# Patient Record
Sex: Male | Born: 1950 | Race: White | Hispanic: No | Marital: Married | Smoking: Never smoker
Health system: Southern US, Community
[De-identification: ages and names within clinical notes are randomized; demographics above are authoritative.]

## PROBLEM LIST (undated history)

## (undated) DIAGNOSIS — Z8719 Personal history of other diseases of the digestive system: Secondary | ICD-10-CM

## (undated) DIAGNOSIS — Z972 Presence of dental prosthetic device (complete) (partial): Secondary | ICD-10-CM

## (undated) DIAGNOSIS — M1990 Unspecified osteoarthritis, unspecified site: Secondary | ICD-10-CM

## (undated) DIAGNOSIS — I10 Essential (primary) hypertension: Secondary | ICD-10-CM

## (undated) DIAGNOSIS — R350 Frequency of micturition: Secondary | ICD-10-CM

## (undated) DIAGNOSIS — Z8709 Personal history of other diseases of the respiratory system: Secondary | ICD-10-CM

## (undated) DIAGNOSIS — Z8701 Personal history of pneumonia (recurrent): Secondary | ICD-10-CM

## (undated) DIAGNOSIS — M722 Plantar fascial fibromatosis: Secondary | ICD-10-CM

## (undated) DIAGNOSIS — K219 Gastro-esophageal reflux disease without esophagitis: Secondary | ICD-10-CM

## (undated) DIAGNOSIS — E559 Vitamin D deficiency, unspecified: Secondary | ICD-10-CM

## (undated) DIAGNOSIS — M719 Bursopathy, unspecified: Secondary | ICD-10-CM

## (undated) DIAGNOSIS — I4891 Unspecified atrial fibrillation: Secondary | ICD-10-CM

## (undated) DIAGNOSIS — IMO0001 Reserved for inherently not codable concepts without codable children: Secondary | ICD-10-CM

## (undated) DIAGNOSIS — J329 Chronic sinusitis, unspecified: Secondary | ICD-10-CM

## (undated) DIAGNOSIS — E785 Hyperlipidemia, unspecified: Secondary | ICD-10-CM

## (undated) DIAGNOSIS — J449 Chronic obstructive pulmonary disease, unspecified: Secondary | ICD-10-CM

## (undated) DIAGNOSIS — F419 Anxiety disorder, unspecified: Secondary | ICD-10-CM

## (undated) DIAGNOSIS — I499 Cardiac arrhythmia, unspecified: Secondary | ICD-10-CM

## (undated) DIAGNOSIS — H269 Unspecified cataract: Secondary | ICD-10-CM

## (undated) DIAGNOSIS — G43909 Migraine, unspecified, not intractable, without status migrainosus: Secondary | ICD-10-CM

## (undated) DIAGNOSIS — K08109 Complete loss of teeth, unspecified cause, unspecified class: Secondary | ICD-10-CM

## (undated) DIAGNOSIS — G4730 Sleep apnea, unspecified: Secondary | ICD-10-CM

## (undated) DIAGNOSIS — Z973 Presence of spectacles and contact lenses: Secondary | ICD-10-CM

## (undated) HISTORY — DX: Gastro-esophageal reflux disease without esophagitis: K21.9

## (undated) HISTORY — PX: KNEE ARTHROSCOPY: SUR90

## (undated) HISTORY — DX: Essential (primary) hypertension: I10

## (undated) HISTORY — DX: Vitamin D deficiency, unspecified: E55.9

## (undated) HISTORY — PX: SHOULDER ARTHROSCOPY: SHX128

## (undated) HISTORY — PX: CARDIAC CATHETERIZATION: SHX172

## (undated) HISTORY — DX: Plantar fascial fibromatosis: M72.2

## (undated) HISTORY — DX: Migraine, unspecified, not intractable, without status migrainosus: G43.909

## (undated) HISTORY — DX: Chronic obstructive pulmonary disease, unspecified: J44.9

## (undated) HISTORY — DX: Hyperlipidemia, unspecified: E78.5

## (undated) HISTORY — DX: Bursopathy, unspecified: M71.9

## (undated) HISTORY — DX: Chronic sinusitis, unspecified: J32.9

## (undated) HISTORY — PX: ESOPHAGOGASTRODUODENOSCOPY: SHX1529

## (undated) HISTORY — PX: CHOLECYSTECTOMY: SHX55

## (undated) HISTORY — PX: FINGER SURGERY: SHX640

## (undated) HISTORY — PX: COLONOSCOPY: SHX174

## (undated) HISTORY — DX: Sleep apnea, unspecified: G47.30

## (undated) HISTORY — DX: Unspecified atrial fibrillation: I48.91

## (undated) HISTORY — DX: Unspecified osteoarthritis, unspecified site: M19.90

---

## 1985-09-02 HISTORY — PX: EYE SURGERY: SHX253

## 2004-10-01 ENCOUNTER — Ambulatory Visit: Payer: Self-pay | Admitting: Internal Medicine

## 2004-10-29 ENCOUNTER — Ambulatory Visit: Payer: Self-pay | Admitting: Internal Medicine

## 2004-12-11 ENCOUNTER — Ambulatory Visit: Payer: Self-pay | Admitting: Internal Medicine

## 2004-12-12 ENCOUNTER — Ambulatory Visit: Payer: Self-pay | Admitting: Internal Medicine

## 2011-06-28 ENCOUNTER — Ambulatory Visit (HOSPITAL_COMMUNITY)
Admission: RE | Admit: 2011-06-28 | Discharge: 2011-06-28 | Disposition: A | Discharge status: Home / Self Care | Payer: Medicare Other | Source: Ambulatory Visit | Attending: Orthopedic Surgery | Admitting: Orthopedic Surgery

## 2011-06-28 ENCOUNTER — Other Ambulatory Visit (HOSPITAL_COMMUNITY): Payer: Self-pay | Admitting: Orthopedic Surgery

## 2011-06-28 ENCOUNTER — Encounter (HOSPITAL_COMMUNITY)
Admission: RE | Admit: 2011-06-28 | Discharge: 2011-06-28 | Disposition: A | Discharge status: Home / Self Care | Payer: Medicare Other | Source: Ambulatory Visit | Attending: Orthopedic Surgery | Admitting: Orthopedic Surgery

## 2011-06-28 DIAGNOSIS — M7540 Impingement syndrome of unspecified shoulder: Secondary | ICD-10-CM

## 2011-06-28 DIAGNOSIS — Z01818 Encounter for other preprocedural examination: Secondary | ICD-10-CM | POA: Insufficient documentation

## 2011-06-28 DIAGNOSIS — Z01810 Encounter for preprocedural cardiovascular examination: Secondary | ICD-10-CM | POA: Insufficient documentation

## 2011-06-28 DIAGNOSIS — Z01812 Encounter for preprocedural laboratory examination: Secondary | ICD-10-CM | POA: Insufficient documentation

## 2011-06-28 LAB — COMPREHENSIVE METABOLIC PANEL
ALT: 22 U/L (ref 0–53)
AST: 20 U/L (ref 0–37)
Alkaline Phosphatase: 101 U/L (ref 39–117)
CO2: 27 mEq/L (ref 19–32)
Chloride: 104 mEq/L (ref 96–112)
GFR calc non Af Amer: 79 mL/min — ABNORMAL LOW (ref >90)
Glucose, Bld: 106 mg/dL — ABNORMAL HIGH (ref 70–99)
Potassium: 4.1 mEq/L (ref 3.5–5.1)
Sodium: 141 mEq/L (ref 135–145)
Total Bilirubin: 1 mg/dL (ref 0.3–1.2)

## 2011-06-28 LAB — URINALYSIS, ROUTINE W REFLEX MICROSCOPIC
Glucose, UA: NEGATIVE mg/dL
Hgb urine dipstick: NEGATIVE
Leukocytes, UA: NEGATIVE
pH: 6 (ref 5.0–8.0)

## 2011-06-28 LAB — APTT: aPTT: 32 seconds (ref 24–37)

## 2011-06-28 LAB — CBC
HCT: 47.2 % (ref 39.0–52.0)
Hemoglobin: 15.8 g/dL (ref 13.0–17.0)
RBC: 5.5 MIL/uL (ref 4.22–5.81)

## 2011-07-04 ENCOUNTER — Ambulatory Visit (HOSPITAL_COMMUNITY)
Admission: RE | Admit: 2011-07-04 | Discharge: 2011-07-05 | Disposition: A | Discharge status: Home / Self Care | Payer: Medicare Other | Source: Ambulatory Visit | Attending: Orthopedic Surgery | Admitting: Orthopedic Surgery

## 2011-07-04 DIAGNOSIS — Z01812 Encounter for preprocedural laboratory examination: Secondary | ICD-10-CM | POA: Insufficient documentation

## 2011-07-04 DIAGNOSIS — M9420 Chondromalacia, unspecified site: Secondary | ICD-10-CM | POA: Insufficient documentation

## 2011-07-04 DIAGNOSIS — M24119 Other articular cartilage disorders, unspecified shoulder: Secondary | ICD-10-CM | POA: Insufficient documentation

## 2011-07-04 DIAGNOSIS — M67919 Unspecified disorder of synovium and tendon, unspecified shoulder: Secondary | ICD-10-CM | POA: Insufficient documentation

## 2011-07-04 DIAGNOSIS — Z01810 Encounter for preprocedural cardiovascular examination: Secondary | ICD-10-CM | POA: Insufficient documentation

## 2011-07-04 DIAGNOSIS — I10 Essential (primary) hypertension: Secondary | ICD-10-CM | POA: Insufficient documentation

## 2011-07-04 DIAGNOSIS — K219 Gastro-esophageal reflux disease without esophagitis: Secondary | ICD-10-CM | POA: Insufficient documentation

## 2011-07-04 DIAGNOSIS — E669 Obesity, unspecified: Secondary | ICD-10-CM | POA: Insufficient documentation

## 2011-07-04 DIAGNOSIS — M25819 Other specified joint disorders, unspecified shoulder: Secondary | ICD-10-CM | POA: Insufficient documentation

## 2011-07-04 DIAGNOSIS — Z01818 Encounter for other preprocedural examination: Secondary | ICD-10-CM | POA: Insufficient documentation

## 2011-07-04 DIAGNOSIS — M19019 Primary osteoarthritis, unspecified shoulder: Secondary | ICD-10-CM | POA: Insufficient documentation

## 2011-07-04 DIAGNOSIS — G4733 Obstructive sleep apnea (adult) (pediatric): Secondary | ICD-10-CM | POA: Insufficient documentation

## 2011-07-04 DIAGNOSIS — M719 Bursopathy, unspecified: Secondary | ICD-10-CM | POA: Insufficient documentation

## 2012-07-24 ENCOUNTER — Encounter: Payer: Self-pay | Admitting: Internal Medicine

## 2012-07-27 ENCOUNTER — Encounter: Payer: Self-pay | Admitting: Internal Medicine

## 2012-07-27 ENCOUNTER — Ambulatory Visit (INDEPENDENT_AMBULATORY_CARE_PROVIDER_SITE_OTHER): Payer: Medicare Other | Admitting: Internal Medicine

## 2012-07-27 ENCOUNTER — Ambulatory Visit (INDEPENDENT_AMBULATORY_CARE_PROVIDER_SITE_OTHER)
Admission: RE | Admit: 2012-07-27 | Discharge: 2012-07-27 | Disposition: A | Discharge status: Home / Self Care | Payer: Medicare Other | Source: Ambulatory Visit | Attending: Internal Medicine | Admitting: Internal Medicine

## 2012-07-27 VITALS — BP 112/76 | HR 75 | Temp 97.3°F | Ht 70.0 in | Wt 227.0 lb

## 2012-07-27 DIAGNOSIS — J45909 Unspecified asthma, uncomplicated: Secondary | ICD-10-CM

## 2012-07-27 DIAGNOSIS — R0609 Other forms of dyspnea: Secondary | ICD-10-CM

## 2012-07-27 DIAGNOSIS — R0600 Dyspnea, unspecified: Secondary | ICD-10-CM

## 2012-07-27 MED ORDER — BUDESONIDE-FORMOTEROL FUMARATE 160-4.5 MCG/ACT IN AERO: INHALATION_SPRAY | RESPIRATORY_TRACT | Status: AC

## 2012-07-27 MED ORDER — ALBUTEROL SULFATE (2.5 MG/3ML) 0.083% IN NEBU: 2.5000 mg | INHALATION_SOLUTION | RESPIRATORY_TRACT | Status: AC | PRN

## 2012-07-27 MED ORDER — FAMOTIDINE 20 MG PO TABS: ORAL_TABLET | ORAL | Status: DC

## 2012-07-27 NOTE — Progress Notes (Signed)
  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  Assoc with mostly dry cough and some sorethroat and dysphagia.  No obvious pattern to daytime variabilty or assoc  cp or chest tightness, subjective wheeze overt sinus or hb symptoms. No unusual exp hx or h/o childhood pna/ asthma or premature birth to his knowledge.   Sleeping ok without nocturnal  or early am exacerbation  of respiratory  c/o's or need for noct saba. Also denies any obvious fluctuation of symptoms with weather or environmental changes or other aggravating or alleviating factors except as outlined above    Review of Systems  Constitutional: Negative for fever, chills, activity change, appetite change and unexpected weight change.  HENT: Positive for sore throat and trouble swallowing. Negative for congestion, rhinorrhea, sneezing, dental problem, voice change and postnasal drip.   Eyes: Negative for visual disturbance.  Respiratory: Positive for cough and shortness of breath. Negative for choking.   Cardiovascular: Positive for chest pain and leg swelling.  Gastrointestinal: Negative for nausea, vomiting and abdominal pain.  Genitourinary: Negative for difficulty urinating.  Musculoskeletal: Positive for arthralgias.  Skin: Negative for rash.  Psychiatric/Behavioral: Negative for behavioral problems and confusion.       Objective:   Physical Exam Wt Readings from Last 3 Encounters:  07/27/12 227 lb (102.967 kg)  06/28/11  230 lb 6.1 oz (104.5 kg)    Obese wm with pot bellied pattern HEENT: nl dentition, turbinates, and orophanx. Nl external ear canals without cough reflex   NECK :  without JVD/Nodes/TM/ nl carotid upstrokes bilaterally   LUNGS: no acc muscle use, clear to A and P bilaterally without cough on insp or exp maneuvers   CV:  RRR  no s3 or murmur or increase in P2, no edema   ABD:  soft and nontender with nl excursion in the supine position. No bruits or organomegaly, bowel sounds nl  MS:  warm without deformities, calf tenderness, cyanosis or clubbing  SKIN: warm and dry without lesions    NEURO:  alert, approp, no deficits    CXR  07/27/2012 :  Hypoinflation with mild vascular crowding in the infrahilar regions due to low lung volumes. No acute disease.        Assessment & Plan:

## 2012-07-27 NOTE — Progress Notes (Signed)
Quick Note:  Spoke with pt and notified of results per Dr. Wert. Pt verbalized understanding and denied any questions.  ______ 

## 2012-07-27 NOTE — Assessment & Plan Note (Signed)
-   07/27/2012  Walked RA x 3 laps @ 185 ft each stopped due to  End of study no sob, no desat  Symptoms are markedly disproportionate to objective findings and not clear this is a lung problem but pt does appear to have difficult airway management issues. DDX of  difficult airways managment all start with A and  include Adherence, Ace Inhibitors, Acid Reflux, Active Sinus Disease, Alpha 1 Antitripsin deficiency, Anxiety masquerading as Airways dz,  ABPA,  allergy(esp in young), Aspiration (esp in elderly), Adverse effects of DPI,  Active smokers, plus two Bs  = Bronchiectasis and Beta blocker use..and one C= CHF  Adherence is always the initial "prime suspect" and is a multilayered concern that requires a "trust but verify" approach in every patient - starting with knowing how to use medications, especially inhalers, correctly, keeping up with refills and understanding the fundamental difference between maintenance and prns vs those medications only taken for a very short course and then stopped and not refilled. The proper method of use, as well as anticipated side effects, of a metered-dose inhaler are discussed and demonstrated to the patient. Improved effectiveness after extensive coaching during this visit to a level of approximately  75% so try symbicort 160 2bid  ? Adverse effect of dpi's > try stop advair and spiriva  ? Acid reflux related > gerd rx/ diet reviewed  No evidence at all to suggest black lung dz here radiographically now 20 years since last exposure.

## 2012-07-27 NOTE — Assessment & Plan Note (Addendum)
Goal will be to maintain improvement off systemic steroids which may complicate his needed efforts to lose wt.  The proper method of use, as well as anticipated side effects, of a metered-dose inhaler are discussed and demonstrated to the patient. Improved effectiveness after extensive coaching during this visit to a level of approximately  75%

## 2012-07-27 NOTE — Patient Instructions (Signed)
Stop advair and spiriva  symbicort 160 Take 2 puffs first thing in am and then another 2 puffs about 12 hours later.     Work on inhaler technique:  relax and gently blow all the way out then take a nice smooth deep breath back in, triggering the inhaler at same time you start breathing in.  Hold for up to 5 seconds if you can.  Rinse and gargle with water when done   If your mouth or throat starts to bother you,   I suggest you time the inhaler to your dental care and after using the inhaler(s) brush teeth and tongue with a baking soda containing toothpaste and when you rinse this out, gargle with it first to see if this helps your mouth and throat.     Please remember to go to the  x-ray department downstairs for your tests - we will call you with the results when they are available.  Please schedule a follow up office visit in 6 weeks, call sooner if needed with pfts

## 2012-07-29 ENCOUNTER — Encounter: Payer: Self-pay | Admitting: Internal Medicine

## 2012-09-11 ENCOUNTER — Ambulatory Visit: Payer: Medicare Other | Admitting: Internal Medicine

## 2015-06-06 ENCOUNTER — Encounter (HOSPITAL_COMMUNITY): Payer: Self-pay

## 2015-06-06 ENCOUNTER — Encounter (HOSPITAL_COMMUNITY)
Admission: RE | Admit: 2015-06-06 | Discharge: 2015-06-06 | Disposition: A | Discharge status: Home / Self Care | Payer: Medicare HMO | Source: Ambulatory Visit | Attending: Orthopedic Surgery | Admitting: Orthopedic Surgery

## 2015-06-06 ENCOUNTER — Other Ambulatory Visit: Payer: Self-pay

## 2015-06-06 DIAGNOSIS — X58XXXA Exposure to other specified factors, initial encounter: Secondary | ICD-10-CM | POA: Diagnosis not present

## 2015-06-06 DIAGNOSIS — G4733 Obstructive sleep apnea (adult) (pediatric): Secondary | ICD-10-CM | POA: Diagnosis not present

## 2015-06-06 DIAGNOSIS — Z7951 Long term (current) use of inhaled steroids: Secondary | ICD-10-CM | POA: Diagnosis not present

## 2015-06-06 DIAGNOSIS — M75122 Complete rotator cuff tear or rupture of left shoulder, not specified as traumatic: Secondary | ICD-10-CM | POA: Diagnosis not present

## 2015-06-06 DIAGNOSIS — M94212 Chondromalacia, left shoulder: Secondary | ICD-10-CM | POA: Diagnosis not present

## 2015-06-06 DIAGNOSIS — S46112A Strain of muscle, fascia and tendon of long head of biceps, left arm, initial encounter: Secondary | ICD-10-CM | POA: Diagnosis not present

## 2015-06-06 DIAGNOSIS — Z791 Long term (current) use of non-steroidal anti-inflammatories (NSAID): Secondary | ICD-10-CM | POA: Diagnosis not present

## 2015-06-06 DIAGNOSIS — M7542 Impingement syndrome of left shoulder: Secondary | ICD-10-CM | POA: Diagnosis not present

## 2015-06-06 DIAGNOSIS — Z79891 Long term (current) use of opiate analgesic: Secondary | ICD-10-CM | POA: Diagnosis not present

## 2015-06-06 DIAGNOSIS — E669 Obesity, unspecified: Secondary | ICD-10-CM | POA: Diagnosis not present

## 2015-06-06 DIAGNOSIS — J449 Chronic obstructive pulmonary disease, unspecified: Secondary | ICD-10-CM | POA: Diagnosis not present

## 2015-06-06 DIAGNOSIS — E559 Vitamin D deficiency, unspecified: Secondary | ICD-10-CM | POA: Diagnosis not present

## 2015-06-06 DIAGNOSIS — I10 Essential (primary) hypertension: Secondary | ICD-10-CM | POA: Diagnosis not present

## 2015-06-06 DIAGNOSIS — S43492A Other sprain of left shoulder joint, initial encounter: Secondary | ICD-10-CM | POA: Diagnosis not present

## 2015-06-06 DIAGNOSIS — Z79899 Other long term (current) drug therapy: Secondary | ICD-10-CM | POA: Diagnosis not present

## 2015-06-06 DIAGNOSIS — K219 Gastro-esophageal reflux disease without esophagitis: Secondary | ICD-10-CM | POA: Diagnosis not present

## 2015-06-06 DIAGNOSIS — M1990 Unspecified osteoarthritis, unspecified site: Secondary | ICD-10-CM | POA: Diagnosis not present

## 2015-06-06 DIAGNOSIS — E785 Hyperlipidemia, unspecified: Secondary | ICD-10-CM | POA: Diagnosis not present

## 2015-06-06 DIAGNOSIS — Z6830 Body mass index (BMI) 30.0-30.9, adult: Secondary | ICD-10-CM | POA: Diagnosis not present

## 2015-06-06 HISTORY — DX: Personal history of pneumonia (recurrent): Z87.01

## 2015-06-06 HISTORY — DX: Reserved for inherently not codable concepts without codable children: IMO0001

## 2015-06-06 HISTORY — DX: Cardiac arrhythmia, unspecified: I49.9

## 2015-06-06 HISTORY — DX: Personal history of other diseases of the digestive system: Z87.19

## 2015-06-06 HISTORY — DX: Frequency of micturition: R35.0

## 2015-06-06 HISTORY — DX: Personal history of other diseases of the respiratory system: Z87.09

## 2015-06-06 LAB — COMPREHENSIVE METABOLIC PANEL WITH GFR
ALT: 19 U/L (ref 17–63)
AST: 24 U/L (ref 15–41)
Albumin: 3.8 g/dL (ref 3.5–5.0)
Alkaline Phosphatase: 95 U/L (ref 38–126)
Anion gap: 8 (ref 5–15)
BUN: 8 mg/dL (ref 6–20)
CO2: 24 mmol/L (ref 22–32)
Calcium: 9.1 mg/dL (ref 8.9–10.3)
Chloride: 106 mmol/L (ref 101–111)
Creatinine, Ser: 0.84 mg/dL (ref 0.61–1.24)
GFR calc Af Amer: >60 mL/min
GFR calc non Af Amer: >60 mL/min
Glucose, Bld: 97 mg/dL (ref 65–99)
Potassium: 3.8 mmol/L (ref 3.5–5.1)
Sodium: 138 mmol/L (ref 135–145)
Total Bilirubin: 1.1 mg/dL (ref 0.3–1.2)
Total Protein: 6.8 g/dL (ref 6.5–8.1)

## 2015-06-06 LAB — CBC WITH DIFFERENTIAL/PLATELET
BASOS PCT: 1 %
Basophils Absolute: 0.1 10*3/uL (ref 0.0–0.1)
Eosinophils Absolute: 0.2 10*3/uL (ref 0.0–0.7)
Eosinophils Relative: 3 %
HEMATOCRIT: 48.3 % (ref 39.0–52.0)
Hemoglobin: 16 g/dL (ref 13.0–17.0)
Lymphocytes Relative: 30 %
Lymphs Abs: 1.7 10*3/uL (ref 0.7–4.0)
MCH: 29.7 pg (ref 26.0–34.0)
MCHC: 33.1 g/dL (ref 30.0–36.0)
MCV: 89.8 fL (ref 78.0–100.0)
MONO ABS: 0.8 10*3/uL (ref 0.1–1.0)
MONOS PCT: 14 %
NEUTROS ABS: 3 10*3/uL (ref 1.7–7.7)
Neutrophils Relative %: 52 %
Platelets: 244 10*3/uL (ref 150–400)
RBC: 5.38 MIL/uL (ref 4.22–5.81)
RDW: 13.3 % (ref 11.5–15.5)
WBC: 5.7 10*3/uL (ref 4.0–10.5)

## 2015-06-06 LAB — APTT: aPTT: 28 s (ref 24–37)

## 2015-06-06 LAB — PROTIME-INR
INR: 1 (ref 0.00–1.49)
Prothrombin Time: 13.4 s (ref 11.6–15.2)

## 2015-06-06 NOTE — Pre-Procedure Instructions (Signed)
    Ethan Harper  06/06/2015      [id number] - [place], [name] - [address] [place] [address] Phone: [phone] Fax: [phone]    Your procedure is scheduled on Thursday, October 6th, 2016.   Report to [facility] Admitting at 7:50 A.M.  Call this number if you have problems the morning of surgery:  [phone]   Remember:  Do not eat food or drink liquids after midnight.   Take these medicines the morning of surgery with A SIP OF WATER: Albuterol nebulizer if needed, Amlodipine (Norvasc), Azelastine (Astelin) if needed, Budesonide-formoterol inhaler, Esomeprazole (Nexium), Famotidine (Pepcid), Hydrocodone-acetaminophen (Norco) if needed, Triamcinolone (Nasocort) if needed.  Stop taking: Aspirin, NSAIDS, Naproxen, Aleve, Ibuprofen, Motrin, Advil, BC's, Goody's, fish oil, all herbal medications, and all vitamins.     Do not wear jewelry.  Do not wear lotions, powders, or colognes.  You may NOT wear deodorant.  Men may shave face and neck.  Do not bring valuables to the hospital.  [facility] is not responsible for any belongings or valuables.  Contacts, dentures or bridgework may not be worn into surgery.  Leave your suitcase in the car.  After surgery it may be brought to your room.  For patients admitted to the hospital, discharge time will be determined by your treatment team.  Patients discharged the day of surgery will not be allowed to drive home.   Special instructions:  See attached.   Please read over the following fact sheets that you were given. Pain Booklet, Coughing and Deep Breathing and Surgical Site Infection Prevention

## 2015-06-06 NOTE — Progress Notes (Signed)
PCP - Dr. Shary Decamp Cardiologist - Dr. Tomie China at [facility] - pt. States that he has not seen in approximately 7 years  EKG- 06/06/15 CXR - denies  Echo- denies Stress test - requested from [facility] - apprx. 7 years ago Cardiac Cath - requested from [facility] - approx. 10 years ago  Sleep study requested from Dr. Blenda Nicely.  Patient reports that he does not wear his CPAP at night.  Patient denies shortness of breath and chest pain at PAT appointment.

## 2015-06-07 ENCOUNTER — Encounter (HOSPITAL_COMMUNITY): Payer: Self-pay

## 2015-06-07 MED ORDER — CHLORHEXIDINE GLUCONATE 4 % EX LIQD: 60.0000 mL | Freq: Once | CUTANEOUS | Status: DC

## 2015-06-07 MED ORDER — CEFAZOLIN SODIUM-DEXTROSE 2-3 GM-% IV SOLR
2.0000 g | INTRAVENOUS | Status: AC
  Administered 2015-06-08: 2 g via INTRAVENOUS
  Filled 2015-06-07: qty 50

## 2015-06-07 MED ORDER — LACTATED RINGERS IV SOLN: INTRAVENOUS | Status: DC

## 2015-06-07 NOTE — Progress Notes (Signed)
Anesthesia Chart Review: Patient is a 64 year old male scheduled for left shoulder arthroscopy with subacromial decompression and rotator cuff repair on 06/08/15 by Dr. Rennis Chris.   History includes non-smoker, COPD, HTN, HLD, migraines, OSA (no CPAP), GERD, SOB, hiatal hernia, dysrhythmia (not specified), cholecystectomy. PCP is Dr. Shary Decamp. Pulmonologist is Dr. Blenda Nicely. Has seen cardiologist Dr. Tomie China (now with [facility]) in the past, but had normal coronaries with NL LVF by [facility] in 2010.   Meds include albuterol, Elavil, amlodipine, Astelin, Symbicort, Nexium, Pepcid, Lasix, Norco, Livalo, Nasacort, Zomig, Ambien.   06/06/15 EKG: NSR, minimal voltage criteria for LVH, may be normal variant.  12/15/08 [facility] ([facility]; scanned under Media tab, encounter 07/04/11): Normal coronaries, normal LVF.  Last echo seen was from 04/09/02 and was normal other than mild TR. (Scanned under Media tab, encounter 07/04/11.)  Preoperative labs WNL.  Anticipate that he can proceed as planned.  Velna Ochs [facility] Short Stay Center/Anesthesiology Phone [phone] 06/07/2015 1:36 PM

## 2015-06-08 ENCOUNTER — Observation Stay (HOSPITAL_COMMUNITY)
Admission: RE | Admit: 2015-06-08 | Discharge: 2015-06-09 | Disposition: A | Discharge status: Home / Self Care | Payer: Medicare HMO | Source: Ambulatory Visit | Attending: Orthopedic Surgery | Admitting: Orthopedic Surgery

## 2015-06-08 ENCOUNTER — Encounter (HOSPITAL_COMMUNITY): Payer: Self-pay | Admitting: *Deleted

## 2015-06-08 ENCOUNTER — Ambulatory Visit (HOSPITAL_COMMUNITY): Payer: Medicare HMO | Admitting: Anesthesiology

## 2015-06-08 ENCOUNTER — Encounter (HOSPITAL_COMMUNITY)
Admission: RE | Disposition: A | Discharge status: Home / Self Care | Payer: Self-pay | Source: Ambulatory Visit | Attending: Orthopedic Surgery

## 2015-06-08 ENCOUNTER — Ambulatory Visit (HOSPITAL_COMMUNITY): Payer: Medicare HMO | Admitting: Vascular Surgery

## 2015-06-08 DIAGNOSIS — S43492A Other sprain of left shoulder joint, initial encounter: Secondary | ICD-10-CM | POA: Diagnosis not present

## 2015-06-08 DIAGNOSIS — Z7951 Long term (current) use of inhaled steroids: Secondary | ICD-10-CM | POA: Insufficient documentation

## 2015-06-08 DIAGNOSIS — Z79891 Long term (current) use of opiate analgesic: Secondary | ICD-10-CM | POA: Insufficient documentation

## 2015-06-08 DIAGNOSIS — X58XXXA Exposure to other specified factors, initial encounter: Secondary | ICD-10-CM | POA: Insufficient documentation

## 2015-06-08 DIAGNOSIS — J449 Chronic obstructive pulmonary disease, unspecified: Secondary | ICD-10-CM | POA: Insufficient documentation

## 2015-06-08 DIAGNOSIS — S46112A Strain of muscle, fascia and tendon of long head of biceps, left arm, initial encounter: Secondary | ICD-10-CM | POA: Insufficient documentation

## 2015-06-08 DIAGNOSIS — M94212 Chondromalacia, left shoulder: Secondary | ICD-10-CM | POA: Diagnosis not present

## 2015-06-08 DIAGNOSIS — E785 Hyperlipidemia, unspecified: Secondary | ICD-10-CM | POA: Insufficient documentation

## 2015-06-08 DIAGNOSIS — M75122 Complete rotator cuff tear or rupture of left shoulder, not specified as traumatic: Secondary | ICD-10-CM | POA: Insufficient documentation

## 2015-06-08 DIAGNOSIS — E669 Obesity, unspecified: Secondary | ICD-10-CM | POA: Insufficient documentation

## 2015-06-08 DIAGNOSIS — K219 Gastro-esophageal reflux disease without esophagitis: Secondary | ICD-10-CM | POA: Insufficient documentation

## 2015-06-08 DIAGNOSIS — Z9889 Other specified postprocedural states: Secondary | ICD-10-CM

## 2015-06-08 DIAGNOSIS — E559 Vitamin D deficiency, unspecified: Secondary | ICD-10-CM | POA: Insufficient documentation

## 2015-06-08 DIAGNOSIS — M1990 Unspecified osteoarthritis, unspecified site: Secondary | ICD-10-CM | POA: Insufficient documentation

## 2015-06-08 DIAGNOSIS — Z791 Long term (current) use of non-steroidal anti-inflammatories (NSAID): Secondary | ICD-10-CM | POA: Insufficient documentation

## 2015-06-08 DIAGNOSIS — M7542 Impingement syndrome of left shoulder: Principal | ICD-10-CM | POA: Insufficient documentation

## 2015-06-08 DIAGNOSIS — Z79899 Other long term (current) drug therapy: Secondary | ICD-10-CM | POA: Insufficient documentation

## 2015-06-08 DIAGNOSIS — G4733 Obstructive sleep apnea (adult) (pediatric): Secondary | ICD-10-CM | POA: Insufficient documentation

## 2015-06-08 DIAGNOSIS — Z6830 Body mass index (BMI) 30.0-30.9, adult: Secondary | ICD-10-CM | POA: Insufficient documentation

## 2015-06-08 DIAGNOSIS — I10 Essential (primary) hypertension: Secondary | ICD-10-CM | POA: Insufficient documentation

## 2015-06-08 HISTORY — PX: SHOULDER ARTHROSCOPY WITH SUBACROMIAL DECOMPRESSION, ROTATOR CUFF REPAIR AND BICEP TENDON REPAIR: SHX5687

## 2015-06-08 SURGERY — SHOULDER ARTHROSCOPY WITH SUBACROMIAL DECOMPRESSION, ROTATOR CUFF REPAIR AND BICEP TENDON REPAIR
Anesthesia: General | Site: Shoulder | Laterality: Left

## 2015-06-08 MED ORDER — KETOROLAC TROMETHAMINE 15 MG/ML IJ SOLN
15.0000 mg | Freq: Three times a day (TID) | INTRAMUSCULAR | Status: DC
  Administered 2015-06-08 – 2015-06-09 (×3): 15 mg via INTRAVENOUS
  Filled 2015-06-08 (×3): qty 1

## 2015-06-08 MED ORDER — FENTANYL CITRATE (PF) 100 MCG/2ML IJ SOLN
INTRAMUSCULAR | Status: DC | PRN
  Administered 2015-06-08: 50 ug via INTRAVENOUS
  Administered 2015-06-08: 100 ug via INTRAVENOUS
  Administered 2015-06-08: 25 ug via INTRAVENOUS

## 2015-06-08 MED ORDER — ONDANSETRON HCL 4 MG/2ML IJ SOLN
INTRAMUSCULAR | Status: DC | PRN
  Administered 2015-06-08: 4 mg via INTRAVENOUS

## 2015-06-08 MED ORDER — MIDAZOLAM HCL 2 MG/2ML IJ SOLN
INTRAMUSCULAR | Status: AC
  Administered 2015-06-08: 2 mg
  Filled 2015-06-08: qty 2

## 2015-06-08 MED ORDER — FENTANYL CITRATE (PF) 100 MCG/2ML IJ SOLN: 25.0000 ug | INTRAMUSCULAR | Status: DC | PRN

## 2015-06-08 MED ORDER — BUDESONIDE-FORMOTEROL FUMARATE 160-4.5 MCG/ACT IN AERO
2.0000 | INHALATION_SPRAY | Freq: Two times a day (BID) | RESPIRATORY_TRACT | Status: DC
  Filled 2015-06-08: qty 6

## 2015-06-08 MED ORDER — PRAVASTATIN SODIUM 40 MG PO TABS
40.0000 mg | ORAL_TABLET | Freq: Every day | ORAL | Status: DC
  Administered 2015-06-08: 40 mg via ORAL
  Filled 2015-06-08: qty 1

## 2015-06-08 MED ORDER — HYDROMORPHONE HCL 2 MG PO TABS: 2.0000 mg | ORAL_TABLET | ORAL | Status: DC | PRN

## 2015-06-08 MED ORDER — FENTANYL CITRATE (PF) 100 MCG/2ML IJ SOLN
INTRAMUSCULAR | Status: AC
  Administered 2015-06-08: 50 ug
  Filled 2015-06-08: qty 2

## 2015-06-08 MED ORDER — GLYCOPYRROLATE 0.2 MG/ML IJ SOLN
INTRAMUSCULAR | Status: AC
  Filled 2015-06-08: qty 4

## 2015-06-08 MED ORDER — NEOSTIGMINE METHYLSULFATE 10 MG/10ML IV SOLN
INTRAVENOUS | Status: DC | PRN
  Administered 2015-06-08: 5 mg via INTRAVENOUS

## 2015-06-08 MED ORDER — PHENYLEPHRINE HCL 10 MG/ML IJ SOLN
INTRAMUSCULAR | Status: DC | PRN
  Administered 2015-06-08: 80 ug via INTRAVENOUS
  Administered 2015-06-08: 40 ug via INTRAVENOUS
  Administered 2015-06-08 (×3): 80 ug via INTRAVENOUS

## 2015-06-08 MED ORDER — AZELASTINE HCL 0.1 % NA SOLN: 2.0000 | Freq: Two times a day (BID) | NASAL | Status: DC | PRN

## 2015-06-08 MED ORDER — LIDOCAINE HCL (CARDIAC) 20 MG/ML IV SOLN
INTRAVENOUS | Status: DC | PRN
Start: 2015-06-08 — End: 2015-06-08
  Administered 2015-06-08: 100 mg via INTRAVENOUS

## 2015-06-08 MED ORDER — ALBUTEROL SULFATE (2.5 MG/3ML) 0.083% IN NEBU: 2.5000 mg | INHALATION_SOLUTION | RESPIRATORY_TRACT | Status: DC | PRN

## 2015-06-08 MED ORDER — FLUTICASONE PROPIONATE 50 MCG/ACT NA SUSP
1.0000 | Freq: Every day | NASAL | Status: DC
  Filled 2015-06-08 (×2): qty 16

## 2015-06-08 MED ORDER — GLYCOPYRROLATE 0.2 MG/ML IJ SOLN
INTRAMUSCULAR | Status: DC | PRN
  Administered 2015-06-08: .8 mg via INTRAVENOUS

## 2015-06-08 MED ORDER — NEOSTIGMINE METHYLSULFATE 10 MG/10ML IV SOLN
INTRAVENOUS | Status: AC
  Filled 2015-06-08: qty 4

## 2015-06-08 MED ORDER — LACTATED RINGERS IV SOLN
INTRAVENOUS | Status: DC
  Administered 2015-06-08: 50 mL/h via INTRAVENOUS
  Administered 2015-06-08: 11:00:00 via INTRAVENOUS

## 2015-06-08 MED ORDER — BUPIVACAINE-EPINEPHRINE (PF) 0.5% -1:200000 IJ SOLN
INTRAMUSCULAR | Status: DC | PRN
  Administered 2015-06-08: 15 mL via PERINEURAL

## 2015-06-08 MED ORDER — KETOROLAC TROMETHAMINE 15 MG/ML IJ SOLN
INTRAMUSCULAR | Status: AC
Start: 2015-06-08 — End: 2015-06-09
  Filled 2015-06-08: qty 1

## 2015-06-08 MED ORDER — ROCURONIUM BROMIDE 100 MG/10ML IV SOLN
INTRAVENOUS | Status: DC | PRN
  Administered 2015-06-08: 50 mg via INTRAVENOUS

## 2015-06-08 MED ORDER — GLYCOPYRROLATE 0.2 MG/ML IJ SOLN
INTRAMUSCULAR | Status: AC
Start: 2015-06-08 — End: 2015-06-08
  Filled 2015-06-08: qty 4

## 2015-06-08 MED ORDER — PHENOL 1.4 % MT LIQD: 1.0000 | OROMUCOSAL | Status: DC | PRN

## 2015-06-08 MED ORDER — FENTANYL CITRATE (PF) 250 MCG/5ML IJ SOLN
INTRAMUSCULAR | Status: AC
  Filled 2015-06-08: qty 5

## 2015-06-08 MED ORDER — AMITRIPTYLINE HCL 50 MG PO TABS
50.0000 mg | ORAL_TABLET | Freq: Every day | ORAL | Status: DC
  Administered 2015-06-08: 50 mg via ORAL
  Filled 2015-06-08: qty 1

## 2015-06-08 MED ORDER — ONDANSETRON HCL 4 MG PO TABS: 4.0000 mg | ORAL_TABLET | ORAL | Status: DC | PRN

## 2015-06-08 MED ORDER — ONDANSETRON HCL 4 MG/2ML IJ SOLN: 4.0000 mg | INTRAMUSCULAR | Status: DC | PRN

## 2015-06-08 MED ORDER — PHENYLEPHRINE 40 MCG/ML (10ML) SYRINGE FOR IV PUSH (FOR BLOOD PRESSURE SUPPORT)
PREFILLED_SYRINGE | INTRAVENOUS | Status: AC
  Filled 2015-06-08: qty 10

## 2015-06-08 MED ORDER — LIDOCAINE HCL (CARDIAC) 20 MG/ML IV SOLN
INTRAVENOUS | Status: AC
  Filled 2015-06-08: qty 5

## 2015-06-08 MED ORDER — AMLODIPINE BESYLATE 10 MG PO TABS
10.0000 mg | ORAL_TABLET | Freq: Every day | ORAL | Status: DC
  Administered 2015-06-09: 10 mg via ORAL
  Filled 2015-06-08: qty 1

## 2015-06-08 MED ORDER — FUROSEMIDE 20 MG PO TABS: 20.0000 mg | ORAL_TABLET | Freq: Every day | ORAL | Status: DC | PRN

## 2015-06-08 MED ORDER — EPHEDRINE SULFATE 50 MG/ML IJ SOLN
INTRAMUSCULAR | Status: DC | PRN
  Administered 2015-06-08: 15 mg via INTRAVENOUS
  Administered 2015-06-08 (×3): 10 mg via INTRAVENOUS

## 2015-06-08 MED ORDER — ONDANSETRON HCL 4 MG/2ML IJ SOLN
INTRAMUSCULAR | Status: AC
  Filled 2015-06-08: qty 2

## 2015-06-08 MED ORDER — DIAZEPAM 5 MG PO TABS: 2.5000 mg | ORAL_TABLET | Freq: Four times a day (QID) | ORAL | Status: DC | PRN

## 2015-06-08 MED ORDER — HYDROCODONE-ACETAMINOPHEN 10-325 MG PO TABS: 1.0000 | ORAL_TABLET | ORAL | Status: DC | PRN

## 2015-06-08 MED ORDER — PROPOFOL 10 MG/ML IV BOLUS
INTRAVENOUS | Status: AC
  Filled 2015-06-08: qty 20

## 2015-06-08 MED ORDER — PROMETHAZINE HCL 25 MG/ML IJ SOLN: 6.2500 mg | INTRAMUSCULAR | Status: DC | PRN

## 2015-06-08 MED ORDER — ROCURONIUM BROMIDE 50 MG/5ML IV SOLN
INTRAVENOUS | Status: AC
  Filled 2015-06-08: qty 1

## 2015-06-08 MED ORDER — SODIUM CHLORIDE 0.9 % IR SOLN
Status: DC | PRN
Start: 2015-06-08 — End: 2015-06-08
  Administered 2015-06-08 (×2): 6000 mL

## 2015-06-08 MED ORDER — PANTOPRAZOLE SODIUM 40 MG PO TBEC
40.0000 mg | DELAYED_RELEASE_TABLET | Freq: Every day | ORAL | Status: DC
  Administered 2015-06-08 – 2015-06-09 (×2): 40 mg via ORAL
  Filled 2015-06-08 (×2): qty 1

## 2015-06-08 MED ORDER — EPHEDRINE SULFATE 50 MG/ML IJ SOLN
INTRAMUSCULAR | Status: AC
  Filled 2015-06-08: qty 1

## 2015-06-08 MED ORDER — HYDROCODONE-ACETAMINOPHEN 10-325 MG PO TABS
1.0000 | ORAL_TABLET | ORAL | Status: DC | PRN
  Administered 2015-06-08 – 2015-06-09 (×4): 2 via ORAL
  Filled 2015-06-08 (×4): qty 2

## 2015-06-08 MED ORDER — LACTATED RINGERS IV SOLN
INTRAVENOUS | Status: DC
  Administered 2015-06-08: 16:00:00 via INTRAVENOUS

## 2015-06-08 MED ORDER — PROPOFOL 10 MG/ML IV BOLUS
INTRAVENOUS | Status: DC | PRN
  Administered 2015-06-08: 150 mg via INTRAVENOUS

## 2015-06-08 SURGICAL SUPPLY — 62 items
ANCHOR PEEK 4.75X19.1 SWLK C (Anchor) ×3 IMPLANT
ANCHOR PEEK SWIVEL LOCK 5.5 (Anchor) ×6 IMPLANT
BLADE CUTTER GATOR 3.5 (BLADE) ×3 IMPLANT
BLADE GREAT WHITE 4.2 (BLADE) ×2 IMPLANT
BLADE GREAT WHITE 4.2MM (BLADE) ×1
BLADE SURG 11 STRL SS (BLADE) ×3 IMPLANT
BOOTCOVER CLEANROOM LRG (PROTECTIVE WEAR) IMPLANT
BUR OVAL 4.0 (BURR) ×3 IMPLANT
CANISTER SUCT LVC 12 LTR MEDI- (MISCELLANEOUS) ×3 IMPLANT
CANNULA ACUFLEX KIT 5X76 (CANNULA) ×3 IMPLANT
CANNULA DRILOCK 5.0MMX75MM (CANNULA) ×1
CANNULA DRILOCK 5.0X75 (CANNULA) ×2 IMPLANT
CANNULA TWIST IN 8.25X7CM (CANNULA) ×3 IMPLANT
CLOSURE WOUND 1/2 X4 (GAUZE/BANDAGES/DRESSINGS) ×1
CONNECTOR 5 IN 1 STRAIGHT STRL (MISCELLANEOUS) ×3 IMPLANT
DRAPE INCISE 23X17 IOBAN STRL (DRAPES)
DRAPE INCISE IOBAN 23X17 STRL (DRAPES) IMPLANT
DRAPE ORTHO SPLIT 77X108 STRL (DRAPES) ×4
DRAPE STERI 35X30 U-POUCH (DRAPES) IMPLANT
DRAPE SURG 17X11 SM STRL (DRAPES) ×3 IMPLANT
DRAPE SURG ORHT 6 SPLT 77X108 (DRAPES) ×2 IMPLANT
DRAPE U-SHAPE 47X51 STRL (DRAPES) IMPLANT
DRSG PAD ABDOMINAL 8X10 ST (GAUZE/BANDAGES/DRESSINGS) ×6 IMPLANT
DURAPREP 26ML APPLICATOR (WOUND CARE) ×3 IMPLANT
GAUZE SPONGE 4X4 12PLY STRL (GAUZE/BANDAGES/DRESSINGS) ×3 IMPLANT
GLOVE BIO SURGEON STRL SZ7.5 (GLOVE) ×3 IMPLANT
GLOVE BIO SURGEON STRL SZ8 (GLOVE) ×3 IMPLANT
GLOVE EUDERMIC 7 POWDERFREE (GLOVE) ×3 IMPLANT
GLOVE SS BIOGEL STRL SZ 7.5 (GLOVE) ×1 IMPLANT
GLOVE SUPERSENSE BIOGEL SZ 7.5 (GLOVE) ×2
GOWN STRL REUS W/ TWL LRG LVL3 (GOWN DISPOSABLE) ×1 IMPLANT
GOWN STRL REUS W/ TWL XL LVL3 (GOWN DISPOSABLE) ×4 IMPLANT
GOWN STRL REUS W/TWL LRG LVL3 (GOWN DISPOSABLE) ×2
GOWN STRL REUS W/TWL XL LVL3 (GOWN DISPOSABLE) ×8
KIT BASIN OR (CUSTOM PROCEDURE TRAY) ×3 IMPLANT
KIT ROOM TURNOVER OR (KITS) ×3 IMPLANT
KIT SHOULDER TRACTION (DRAPES) ×3 IMPLANT
MANIFOLD NEPTUNE II (INSTRUMENTS) ×3 IMPLANT
NDL SUT 6 .5 CRC .975X.05 MAYO (NEEDLE) IMPLANT
NEEDLE MAYO TAPER (NEEDLE)
NEEDLE SCORPION MULTI FIRE (NEEDLE) ×3 IMPLANT
NEEDLE SPNL 18GX3.5 QUINCKE PK (NEEDLE) ×3 IMPLANT
NS IRRIG 1000ML POUR BTL (IV SOLUTION) IMPLANT
PACK SHOULDER (CUSTOM PROCEDURE TRAY) ×3 IMPLANT
PAD ARMBOARD 7.5X6 YLW CONV (MISCELLANEOUS) ×6 IMPLANT
SET ARTHROSCOPY TUBING (MISCELLANEOUS) ×3
SET ARTHROSCOPY TUBING LN (MISCELLANEOUS) ×1 IMPLANT
SLING ARM FOAM STRAP LRG (SOFTGOODS) ×3 IMPLANT
SLING ARM MED ADULT FOAM STRAP (SOFTGOODS) IMPLANT
SPONGE LAP 4X18 X RAY DECT (DISPOSABLE) IMPLANT
STRIP CLOSURE SKIN 1/2X4 (GAUZE/BANDAGES/DRESSINGS) ×2 IMPLANT
SUT MNCRL AB 3-0 PS2 18 (SUTURE) ×3 IMPLANT
SUT PDS AB 0 CT 36 (SUTURE) IMPLANT
SUT RETRIEVER GRASP 30 DEG (SUTURE) IMPLANT
SUT TIGER TAPE 7 IN WHITE (SUTURE) ×3 IMPLANT
SYR 20CC LL (SYRINGE) IMPLANT
TAPE FIBER 2MM 7IN #2 BLUE (SUTURE) ×3 IMPLANT
TAPE PAPER 3X10 WHT MICROPORE (GAUZE/BANDAGES/DRESSINGS) ×3 IMPLANT
TOWEL OR 17X24 6PK STRL BLUE (TOWEL DISPOSABLE) ×3 IMPLANT
TOWEL OR 17X26 10 PK STRL BLUE (TOWEL DISPOSABLE) ×3 IMPLANT
WAND SUCTION MAX 4MM 90S (SURGICAL WAND) ×3 IMPLANT
WATER STERILE IRR 1000ML POUR (IV SOLUTION) ×3 IMPLANT

## 2015-06-08 NOTE — Op Note (Signed)
Ethan Harper, Ethan Harper              ACCOUNT NO.:  [id number]  MEDICAL RECORD NO.:  [id number]  LOCATION:  5N11C                        FACILITY:  [facility]  PHYSICIAN:  Vania Rea. Haitham Dolinsky, M.D.  DATE OF BIRTH:  Sep 08, 1950  DATE OF PROCEDURE:  06/08/2015 DATE OF DISCHARGE:                              OPERATIVE REPORT   PREOPERATIVE DIAGNOSES: 1. Chronic left shoulder pain with impingement syndrome. 2. Left shoulder full-thickness rotator cuff tear.  POSTOPERATIVE DIAGNOSES: 1. Chronic left shoulder pain with impingement syndrome. 2. Left shoulder full-thickness rotator cuff tear. 3. Complex and extensive degenerative labral tear. 4. Chondromalacia of the glenoid. 5. Biceps tendon partial tearing.  PROCEDURE: 1. Left shoulder examination under anesthesia. 2. Left shoulder glenohumeral joint diagnostic arthroscopy. 3. Labral debridement. 4. Chondroplasty of the glenoid. 5. Biceps tendon tenotomy. 6. Arthroscopic subacromial decompression and bursectomy. 7. Arthroscopic rotator cuff repair, using a double-row suture bridge     repair construct.  SURGEON:  Vania Rea. Deja Pisarski, M.D.  Threasa HeadsFrench Ana A. Shuford, PA-C.  ANESTHESIA:  General endotracheal as well as an interscalene block.  ESTIMATED BLOOD LOSS:  Minimal.  DRAINS:  None.  HISTORY:  Mr. Marinos is a 64 year old gentleman, who has had persistent left shoulder pain after lifting injury, where he felt "ripping and tearing" in the left shoulder with subsequent loss of motion and strength.  His preoperative MRI scan shows a full-thickness tear of the rotator cuff.  Due to his persistent pain, weakness and functional limitations, he is brought to the operating room at this time for planned left shoulder arthroscopy as described below.  Preoperatively, I counseled Mr. Falotico regarding treatment options and potential risks versus benefits thereof.  Possible surgical complications were all reviewed including bleeding,  infection, neurovascular injury, persistent pain, loss of motion, anesthetic complication, recurrence of rotator cuff tear, and possible need for additional surgery.  He understands and accepts and agrees to planned procedure.  PROCEDURE IN DETAIL:  After undergoing routine preop evaluation, the patient did receive prophylactic antibiotics.  An interscalene block was established in the holding area by the Anesthesia Department.  He was placed supine on the table, underwent smooth induction of general endotracheal anesthesia.  Turned to the right lateral decubitus position on a beanbag and appropriately padded and protected.  Left shoulder examination under anesthesia revealed full motion.  No instability patterns noted.  Left arm was then suspended at 70 degrees of abduction with 10 pounds traction.  Left shoulder girdle region was sterilely prepped and draped in standard fashion.  Time-out was called.  Posterior portal established in glenohumeral joint.  Anterior portal established under direct visualization.  The articular surfaces showed diffuse grade I chondromalacia of the humeral head.  Grade 3+ on the glenoid inferiorly over this inferior third with complex degenerative tearing of the entirety of the labrum.  We performed a circumferential labral debridement chondroplasty of the glenoid with some limited debridement of the humeral head.  The biceps showed marked attenuation and thinning and fraying, degenerative change, performed biceps tenotomy.  Rotator cuff showed an obvious full-thickness defect involving the entirety of the supraspinatus and portion of the infraspinatus.  At this point, fluid and instruments removed from the glenohumeral  joint.  The arm was dropped down to 30 degrees of abduction.  Arthroscope introduced in the subacromial space of the posterior portal and a direct lateral portal site in the subacromial space.  Abundant dense bursal tissue and multiple  adhesions were encountered.  These were all divided and excised, shaver and Stryker wand.  The wand was then used to remove the periosteum from the undersurface of the anterior half of the acromion, and then the subacromial decompression was performed with a burr creating a type 1 morphology.  At this point, we prepared the greater tuberosity for repair of the rotator cuff.  The rotator cuff torn margin was trimmed back to healthy tissue with a shaver.  Greater tuberosity prepared removing soft tissue and abrading the bone to a bleeding bed and had a footprint approximately 3 cm in width.  Through a stab wound off lateral margin acromion, placed an Arthrex SwiveLock suture anchor, loaded with 2 fiber tapes assisted.  There were 6 suture limbs.  The suture limbs were then shuttled, equidistant across the width of the rotator cuff tear.  Using the scorpion suture passed and then we placed 2 lateral row anchors, which allowed [place] to create a double-row suture bridge repair, which nicely compressed the margin of the rotator cuff against the bony bed on tuberosity.  Overall construct was much to our satisfaction.  Suture limbs were all then appropriately clipped.  Final inspection irrigation was completed.  Hemostasis was obtained.  Fluid and instruments were removed.  The portals were closed with Monocryl and Steri-Strips with dry dressing taped at the left shoulder.  Left arm was placed in a sling.  The patient was then awakened, extubated, and taken to recovery room in stable condition.  Ralene Bathe, PA-C was used as an Geophysicist/field seismologist throughout this case, essential for help with positioning of the patient, positioning of the extremity, management of the arthroscopic equipment, tissue manipulation, suture management, wound closure, and intraoperative decision making.     Vania Rea. Ida Milbrath, M.D.     KMS/MEDQ  D:  06/08/2015  T:  06/08/2015  Job:  [id number]

## 2015-06-08 NOTE — Anesthesia Postprocedure Evaluation (Signed)
Anesthesia Post Note  Patient: Engineer, maintenance) Performed: Procedure(s) (LRB): LEFT SHOULDER ARTHROSCOPY WITH SUBACROMIAL DECOMPRESSION, ROTATOR CUFF REPAIR  (Left)  Anesthesia type: general  Patient location: PACU  Post pain: Pain level controlled  Post assessment: Patient's Cardiovascular Status Stable  Last Vitals:  Filed Vitals:   06/08/15 1326  BP: 124/74  Pulse:   Temp:   Resp:     Post vital signs: Reviewed and stable  Level of consciousness: sedated  Complications: No apparent anesthesia complications

## 2015-06-08 NOTE — Op Note (Signed)
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 M.D.  ASSISTANTS: Shuford pac   ANESTHESIA:   GET + ISB  EBL: min  SPECIMEN:  none  Drains: none   PATIENT DISPOSITION:  PACU - hemodynamically stable.    PLAN OF CARE: Admit for overnight observation  Dictation# [id number]   Contact # [phone]

## 2015-06-08 NOTE — Anesthesia Procedure Notes (Addendum)
Anesthesia Regional Block:  Interscalene brachial plexus block  Pre-Anesthetic Checklist: ,, timeout performed, Correct Patient, Correct Site, Correct Laterality, Correct Procedure, Correct Position, site marked, Risks and benefits discussed,  Surgical consent,  Pre-op evaluation,  At surgeon's request and post-op pain management  Laterality: Left  Prep: chloraprep       Needles:  Injection technique: Single-shot  Needle Type: Echogenic Stimulator Needle     Needle Length: 5cm 5 cm Needle Gauge: 22 and 22 G    Additional Needles:  Procedures: ultrasound guided (picture in chart) Interscalene brachial plexus block Narrative:  Injection made incrementally with aspirations every 5 mL.  Performed by: Personally  Anesthesiologist: Cecile Hearing  Additional Notes: Functioning IV was confirmed and monitors were applied.  A 50mm 22ga Arrow echogenic stimulator needle was used. Sterile prep and drape,hand hygiene and sterile gloves were used.  Negative aspiration and negative test dose prior to incremental administration of local anesthetic. The patient tolerated the procedure well.  Ultrasound guidance: relevent anatomy identified, needle position confirmed, local anesthetic spread visualized around nerve(s), vascular puncture avoided.  Image printed for medical record.    Procedure Name: Intubation Date/Time: 06/08/2015 10:02 AM Performed by: Faustino Congress WEAVER Pre-anesthesia Checklist: Patient identified, Emergency Drugs available, Suction available, Patient being monitored and Timeout performed Patient Re-evaluated:Patient Re-evaluated prior to inductionOxygen Delivery Method: Circle system utilized Preoxygenation: Pre-oxygenation with 100% oxygen Intubation Type: IV induction Ventilation: Mask ventilation without difficulty and Oral airway inserted - appropriate to patient size Laryngoscope Size: Hyacinth Meeker and 2 Grade View: Grade I Tube type: Oral Tube size: 7.5  mm Number of attempts: 1 Airway Equipment and Method: Stylet and Oral airway Placement Confirmation: ETT inserted through vocal cords under direct vision,  positive ETCO2 and breath sounds checked- equal and bilateral Secured at: 23 cm Tube secured with: Tape Dental Injury: Teeth and Oropharynx as per pre-operative assessment

## 2015-06-08 NOTE — Discharge Instructions (Signed)
° °  Kevin M. Supple, M.D., F.A.A.O.S. °Orthopaedic Surgery °Specializing in Arthroscopic and Reconstructive °Surgery of the Shoulder and Knee °[phone] °[address] - [place], [place] [address] - Fax [phone] ° °POST-OP SHOULDER ARTHROSCOPIC ROTATOR CUFF  REPAIR INSTRUCTIONS ° °1. Call the office at [phone] to schedule your first post-op appointment 7-10 days from the date of your surgery. ° °2. Leave the steri-strips in place over your incisions when performing dressing changes and showering. You may remove your dressings and begin showering 72 hours from surgery. You can expect drainage that is clear to bloody in nature that occasionally will soak through your dressings. If this occurs go ahead and perform a dressing change. The drainage should lessen daily and when there is no drainage from your incisions feel free to go without a dressing. ° °3. Wear your sling/immobilizer at all times except to perform the exercises below or to occasionally let your arm dangle by your side to stretch your elbow. You also need to sleep in your sling immobilizer until instructed otherwise. ° °4. Range of motion to your elbow, wrist, and hand are encouraged 3-5 times daily. Exercise to your hand and fingers helps to reduce swelling you may experience. ° °5. Utilize ice to the shoulder 3-4 times minimum a day and additionally if you are experiencing pain. ° °6. You may one-armed drive when safely off of narcotics and muscle relaxants. You may use your hand that is in the sling to support the steering wheel only. However, should it be your right arm that is in the sling it is not to be used for gear shifting in a manual transmission. ° °7. If you had a block pre-operatively to provide post-op pain relief you may want to go ahead and begin utilizing your pain meds as your arm begins to wake up. Blocks can sometimes last up to 16-18 hours. If you are still pain-free prior to going to bed you may want to  strongly consider taking a pain medication to avoid being awakened in the night with the onset of pain. A muscle relaxant is also provided for you should you experience muscle spasms. It is recommended that if you are experiencing pain that your pain medication alone is not controlling, add the muscle relaxant along with the pain medication which can give additional pain relief. The first one to two days is generally the most severe of your pain and then should gradually decrease. As your pain lessens it is recommended that you decrease your use of the pain medications to an "as needed basis" only and to always comply with the recommended dosages of the pain medications. ° °8. Pain medications can produce constipation along with their use. If you experience this, the use of an over the counter stool softener or laxative daily is recommended.  ° °9. For additional questions or concerns, please do not hesitate to call the office. If after hours there is an answering service to forward your concerns to the physician on call. ° °POST-OP EXERCISES ° °Pendulum Exercises ° °Perform pendulum exercises while standing and bending at the waist. Support your uninvolved arm on a table or chair and allow your operated arm to hang freely. Make sure to do these exercises passively - not using you shoulder muscle. ° °Repeat 20 times. Do 3 sessions per day. ° ° °

## 2015-06-08 NOTE — Progress Notes (Signed)
Lunch relief by A. Britt RN 

## 2015-06-08 NOTE — H&P (Signed)
[employer] Complaint: left shoulder impingement and rotator cuff tear HPI: The patient is a 64 y.o. male with chronic left shoulder pain and rotator cuff tear  Past Medical History  Diagnosis Date  . COPD (chronic obstructive pulmonary disease) (HCC)   . Vitamin D deficiency   . Hypertension   . Chronic sinusitis   . Hyperlipidemia   . Migraine   . Osteoarthritis   . Plantar fasciitis   . GERD (gastroesophageal reflux disease)   . Bursitis   . Sleep apnea     pt. refuses CPAP  . Dysrhythmia   . Shortness of breath dyspnea   . History of pneumonia   . History of bronchitis   . Urinary frequency   . History of hiatal hernia     Past Surgical History  Procedure Laterality Date  . Knee arthroscopy Bilateral     right x 4; left x 3  . Shoulder arthroscopy Right   . Cholecystectomy    . Finger surgery Right     middle finger  . Eye surgery Right 1987  . Colonoscopy    . Esophagogastroduodenoscopy    . Cardiac catheterization      12/15/2008 HPR: Normal coronaries, NL LVF (Dr. Beverely Pace)    Family History  Problem Relation Age of Onset  . Breast cancer Mother     with mets to bone and lungs  . Emphysema Father     was a smoker  . Heart disease Father     Social History:  reports that he has never smoked. He has never used smokeless tobacco. He reports that he does not drink alcohol or use illicit drugs.  Allergies: No Known Allergies  Medications Prior to Admission  Medication Sig Dispense Refill  . albuterol (PROVENTIL) (2.5 MG/3ML) 0.083% nebulizer solution Take 3 mLs (2.5 mg total) by nebulization every 4 (four) hours as needed for wheezing. 75 mL   . amitriptyline (ELAVIL) 50 MG tablet Take 50 mg by mouth at bedtime.      Marland Kitchen amLODipine (NORVASC) 10 MG tablet Take 10 mg by mouth daily.      Marland Kitchen azelastine (ASTELIN) 137 MCG/SPRAY nasal spray Place 2 sprays into the nose daily as needed for allergies. Use in each nostril as directed    .  budesonide-formoterol (SYMBICORT) 160-4.5 MCG/ACT inhaler Take 2 puffs first thing in am and then another 2 puffs about 12 hours later. 1 Inhaler 12  . esomeprazole (NEXIUM) 40 MG capsule Take 40 mg by mouth daily before breakfast.      . famotidine (PEPCID) 20 MG tablet One at bedtime (Patient taking differently: Take 20 mg by mouth at bedtime. One at bedtime) 30 tablet 11  . furosemide (LASIX) 20 MG tablet Take 20 mg by mouth See admin instructions. As needed for water retention     . HYDROcodone-acetaminophen (NORCO) 5-325 MG per tablet Take 1 tablet by mouth every 6 (six) hours as needed.      Marland Kitchen LIVALO 2 MG TABS Take 1 tablet by mouth daily.  0  . meloxicam (MOBIC) 15 MG tablet Take 15 mg by mouth daily.      Marland Kitchen triamcinolone (NASACORT) 55 MCG/ACT nasal inhaler Place 2 sprays into the nose daily as needed.     Marland Kitchen ZOLMitriptan (ZOMIG) 2.5 MG tablet Take 5 mg by mouth as needed. For migraine.  May repeat once after 2 hours.  Max /day     . zolpidem (AMBIEN) 10 MG tablet Take  10 mg by mouth at bedtime.         Physical Exam: left shoulder with painful and restricted motion as noted at recent office visits  Vitals  Temp:  [97.7 F (36.5 C)] 97.7 F (36.5 C) (10/06 0816) Pulse Rate:  [69-79] 77 (10/06 0905) Resp:  [15-20] 18 (10/06 0905) BP: (97-144)/(71-99) 117/88 mmHg (10/06 0905) SpO2:  [95 %-98 %] 95 % (10/06 0905) Weight:  [100.245 kg (221 lb)] 100.245 kg (221 lb) (10/06 0816)  Assessment/Plan  Impression: left shoulder impingement and rotator cuff tear  Plan of Action: Procedure(s): LEFT SHOULDER ARTHROSCOPY WITH SUBACROMIAL DECOMPRESSION, ROTATOR CUFF REPAIR   Tevyn Codd M Ngina Royer 06/08/2015, 9:29 AM Contact # [phone]

## 2015-06-08 NOTE — Anesthesia Preprocedure Evaluation (Addendum)
Anesthesia Evaluation  Patient identified by MRN, date of birth, ID band Patient awake    Reviewed: Allergy & Precautions, NPO status , Patient's Chart, lab work & pertinent test results  History of Anesthesia Complications Negative for: history of anesthetic complications  Airway Mallampati: II  TM Distance: <3 FB Neck ROM: Full    Dental  (+) Dental Advisory Given, Upper Dentures, Lower Dentures   Pulmonary shortness of breath and with exertion, sleep apnea , COPD,  COPD inhaler,    Pulmonary exam normal breath sounds clear to auscultation       Cardiovascular Exercise Tolerance: Good hypertension, Pt. on medications Normal cardiovascular exam Rhythm:Regular Rate:Normal     Neuro/Psych  Headaches, negative psych ROS   GI/Hepatic Neg liver ROS, hiatal hernia, GERD  Medicated,  Endo/Other  Obesity   Renal/GU negative Renal ROS     Musculoskeletal  (+) Arthritis , Osteoarthritis,    Abdominal   Peds  Hematology negative hematology ROS (+)   Anesthesia Other Findings Day of surgery medications reviewed with the patient.  Reproductive/Obstetrics                        Anesthesia Physical Anesthesia Plan  ASA: III  Anesthesia Plan: General   Post-op Pain Management: GA combined w/ Regional for post-op pain   Induction: Intravenous  Airway Management Planned: Oral ETT  Additional Equipment:   Intra-op Plan:   Post-operative Plan: Extubation in OR  Informed Consent: I have reviewed the patients History and Physical, chart, labs and discussed the procedure including the risks, benefits and alternatives for the proposed anesthesia with the patient or authorized representative who has indicated his/her understanding and acceptance.   Dental advisory given  Plan Discussed with: CRNA  Anesthesia Plan Comments: (Risks/benefits of general anesthesia discussed with patient including risk  of damage to teeth, lips, gum, and tongue, nausea/vomiting, allergic reactions to medications, and the possibility of heart attack, stroke and death.  All patient questions answered.  Patient wishes to proceed.)        Anesthesia Quick Evaluation

## 2015-06-08 NOTE — Transfer of Care (Signed)
Immediate Anesthesia Transfer of Care Note  Patient: Teacher, English as a foreign language  Procedure(s) Performed: Procedure(s): LEFT SHOULDER ARTHROSCOPY WITH SUBACROMIAL DECOMPRESSION, ROTATOR CUFF REPAIR  (Left)  Patient Location: PACU  Anesthesia Type:General and Regional  Level of Consciousness: awake, patient cooperative and responds to stimulation  Airway & Oxygen Therapy: Patient Spontanous Breathing and Patient connected to face mask oxygen  Post-op Assessment: Report given to RN and Post -op Vital signs reviewed and stable  Post vital signs: Reviewed and stable  Last Vitals:  Filed Vitals:   06/08/15 0905  BP: 117/88  Pulse: 77  Temp:   Resp: 18    Complications: No apparent anesthesia complications

## 2015-06-08 NOTE — Progress Notes (Signed)
Full dentures ret to pt

## 2015-06-09 ENCOUNTER — Encounter (HOSPITAL_COMMUNITY): Payer: Self-pay | Admitting: Orthopedic Surgery

## 2015-06-09 DIAGNOSIS — M7542 Impingement syndrome of left shoulder: Secondary | ICD-10-CM | POA: Diagnosis not present

## 2015-06-09 MED ORDER — HYDROMORPHONE HCL 1 MG/ML IJ SOLN
INTRAMUSCULAR | Status: AC
  Administered 2015-06-09: 1 mg
  Filled 2015-06-09: qty 2

## 2015-06-09 MED ORDER — HYDROMORPHONE HCL 1 MG/ML IJ SOLN
1.0000 mg | INTRAMUSCULAR | Status: DC | PRN
  Administered 2015-06-09 (×2): 1 mg via INTRAVENOUS
  Filled 2015-06-09: qty 1

## 2015-06-09 NOTE — Progress Notes (Signed)
Ethan Harper  MRN: [id number] DOB/Age: [birth year] 64 y.o. Physician: Lynnea Maizes, M.D. 1 Day Post-Op Procedure(s) (LRB): LEFT SHOULDER ARTHROSCOPY WITH SUBACROMIAL DECOMPRESSION, ROTATOR CUFF REPAIR  (Left)  Subjective: Poor pain control this am. Vital Signs Temp:  [96.9 F (36.1 C)-98.6 F (37 C)] 98 F (36.7 C) (10/07 0454) Pulse Rate:  [64-79] 71 (10/07 0454) Resp:  [12-22] 18 (10/07 0454) BP: (97-144)/(69-99) 130/83 mmHg (10/07 0454) SpO2:  [94 %-99 %] 99 % (10/07 0454) Weight:  [100.245 kg (221 lb)] 100.245 kg (221 lb) (10/06 0816)  Lab Results  Recent Labs  06/06/15 1003  WBC 5.7  HGB 16.0  HCT 48.3  PLT 244   BMET  Recent Labs  06/06/15 1003  NA 138  K 3.8  CL 106  CO2 24  GLUCOSE 97  BUN 8  CREATININE 0.84  CALCIUM 9.1   INR  Date Value Ref Range Status  06/06/2015 1.00 0.00 - 1.49 Final     Exam  Dressings with scant dried blood. N/V intact  Plan Try IV dilaudid. Has Rx for dilaudid po as well as hydrocodone. Dressing change today prior to d/c D/c home this afternoon  [facility] [name] 06/09/2015, 8:13 AM    Contact # [phone]

## 2015-06-09 NOTE — Progress Notes (Signed)
Dressing changed per Dr Rennis Chris, dry and intact

## 2015-06-28 NOTE — Discharge Summary (Signed)
PATIENT ID:      Ethan ByarsColumbus Harper  MRN:     [id number] DOB/AGE:    65-01-1951 / 64 y.o.     DISCHARGE SUMMARY  ADMISSION DATE:    06/08/2015 DISCHARGE DATE:  06/09/2015  ADMISSION DIAGNOSIS: left shoulder impingement and rotator cuff tear Past Medical History  Diagnosis Date  . COPD (chronic obstructive pulmonary disease) (HCC)   . Vitamin D deficiency   . Hypertension   . Chronic sinusitis   . Hyperlipidemia   . Migraine   . Osteoarthritis   . Plantar fasciitis   . GERD (gastroesophageal reflux disease)   . Bursitis   . Sleep apnea     pt. refuses CPAP  . Dysrhythmia   . Shortness of breath dyspnea   . History of pneumonia   . History of bronchitis   . Urinary frequency   . History of hiatal hernia     DISCHARGE DIAGNOSIS:   Active Problems:   S/P rotator cuff repair   PROCEDURE: Procedure(s): LEFT SHOULDER ARTHROSCOPY WITH SUBACROMIAL DECOMPRESSION, ROTATOR CUFF REPAIR  on 06/08/2015  CONSULTS:   none  HISTORY:  See H&P in chart.  HOSPITAL COURSE:  Ethan Harper is a 64 y.o. admitted on 06/08/2015 with a chief complaint of left shoulder pain, and found to have a diagnosis of left shoulder impingement and rotator cuff tear.  They were brought to the operating room on 06/08/2015 and underwent Procedure(s): LEFT SHOULDER ARTHROSCOPY WITH SUBACROMIAL DECOMPRESSION, ROTATOR CUFF REPAIR .    They were given perioperative antibiotics:  Anti-infectives    Start     Dose/Rate Route Frequency Ordered Stop   06/08/15 0930  ceFAZolin (ANCEF) IVPB 2 g/50 mL premix     2 g 100 mL/hr over 30 Minutes Intravenous To ShortStay Surgical 06/07/15 1358 06/08/15 1005    .  Patient underwent the above named procedure and tolerated it well. The following day they were hemodynamically stable and pain was controlled on oral analgesics. They were neurovascularly intact to the operative extremity. Patient was kept overnight for monitoring secondary to sleep apnea and was breathing well following  surgery. They were medically and orthopaedically stable for discharge on 06/09/2015.    DIAGNOSTIC STUDIES:  RECENT RADIOGRAPHIC STUDIES :  No results found.  RECENT VITAL SIGNS:  No data found. Marland Kitchen.  RECENT EKG RESULTS:    Orders placed or performed in visit on 06/06/15  . EKG 12-Lead    DISCHARGE INSTRUCTIONS:  Discharge Instructions    Change dressing    Complete by:  As directed   Please apply new dressing, 4x4 and paper tape, prior to d/c           DISCHARGE MEDICATIONS:     Medication List    STOP taking these medications        HYDROcodone-acetaminophen 5-325 MG tablet  Commonly known as:  NORCO/VICODIN  Replaced by:  HYDROcodone-acetaminophen 10-325 MG tablet      TAKE these medications        albuterol (2.5 MG/3ML) 0.083% nebulizer solution  Commonly known as:  PROVENTIL  Take 3 mLs (2.5 mg total) by nebulization every 4 (four) hours as needed for wheezing.     amitriptyline 50 MG tablet  Commonly known as:  ELAVIL  Take 50 mg by mouth at bedtime.     amLODipine 10 MG tablet  Commonly known as:  NORVASC  Take 10 mg by mouth daily.     azelastine 0.1 % nasal spray  Commonly known as:  ASTELIN  Place 2 sprays into the nose daily as needed for allergies. Use in each nostril as directed     budesonide-formoterol 160-4.5 MCG/ACT inhaler  Commonly known as:  SYMBICORT  Take 2 puffs first thing in am and then another 2 puffs about 12 hours later.     diazepam 5 MG tablet  Commonly known as:  VALIUM  Take 0.5-1 tablets (2.5-5 mg total) by mouth every 6 (six) hours as needed for muscle spasms or sedation.     esomeprazole 40 MG capsule  Commonly known as:  NEXIUM  Take 40 mg by mouth daily before breakfast.     famotidine 20 MG tablet  Commonly known as:  PEPCID  One at bedtime     furosemide 20 MG tablet  Commonly known as:  LASIX  Take 20 mg by mouth See admin instructions. As needed for water retention     HYDROcodone-acetaminophen 10-325 MG  tablet  Commonly known as:  NORCO  Take 1-2 tablets by mouth every 4 (four) hours as needed for moderate pain.     HYDROmorphone 2 MG tablet  Commonly known as:  DILAUDID  Take 1 tablet (2 mg total) by mouth every 4 (four) hours as needed for severe pain (do not use along with norco).     LIVALO 2 MG Tabs  Generic drug:  Pitavastatin Calcium  Take 1 tablet by mouth daily.     meloxicam 15 MG tablet  Commonly known as:  MOBIC  Take 15 mg by mouth daily.     triamcinolone 55 MCG/ACT nasal inhaler  Commonly known as:  NASACORT  Place 2 sprays into the nose daily as needed.     ZOLMitriptan 2.5 MG tablet  Commonly known as:  ZOMIG  Take 5 mg by mouth as needed. For migraine.  May repeat once after 2 hours.  Max /day     zolpidem 10 MG tablet  Commonly known as:  AMBIEN  Take 10 mg by mouth at bedtime.        FOLLOW UP VISIT:       Follow-up Information    Follow up with Vania Rea SUPPLE, MD.   Specialty:  Orthopedic Surgery   Why:  call to be seen in 7-10 days   Contact information:   [address] [place] [address] [phone]       DISCHARGE TO: Home  DISPOSITION: Good  DISCHARGE CONDITION:  Ethan Harper for Dr. Francena Hanly 06/28/2015, 8:29 AM

## 2016-08-15 NOTE — H&P (Signed)
Ethan Harper is an 65 y.o. male.    Chief Complaint: left knee pain  HPI: Pt is a 65 y.o. male complaining of left knee pain for multiple years. Pain had continually increased since the beginning. X-rays in the clinic show end-stage arthritic changes of the left knee. Pt has tried various conservative treatments which have failed to alleviate their symptoms, including injections and therapy. Various options are discussed with the patient. Risks, benefits and expectations were discussed with the patient. Patient understand the risks, benefits and expectations and wishes to proceed with surgery.   PCP:  Feliciana RossettiGRISSO,GREG, MD  D/C Plans: Home  PMH: Past Medical History:  Diagnosis Date  . Bursitis   . Chronic sinusitis   . COPD (chronic obstructive pulmonary disease) (HCC)   . Dysrhythmia   . GERD (gastroesophageal reflux disease)   . History of bronchitis   . History of hiatal hernia   . History of pneumonia   . Hyperlipidemia   . Hypertension   . Migraine   . Osteoarthritis   . Plantar fasciitis   . Shortness of breath dyspnea   . Sleep apnea    pt. refuses CPAP  . Urinary frequency   . Vitamin D deficiency     PSH: Past Surgical History:  Procedure Laterality Date  . CARDIAC CATHETERIZATION     12/15/2008 HPR: Normal coronaries, NL LVF (Dr. Beverely Paceheek)  . CHOLECYSTECTOMY    . COLONOSCOPY    . ESOPHAGOGASTRODUODENOSCOPY    . EYE SURGERY Right 1987  . FINGER SURGERY Right    middle finger  . KNEE ARTHROSCOPY Bilateral    right x 4; left x 3  . SHOULDER ARTHROSCOPY Right   . SHOULDER ARTHROSCOPY WITH SUBACROMIAL DECOMPRESSION, ROTATOR CUFF REPAIR AND BICEP TENDON REPAIR Left 06/08/2015   Procedure: LEFT SHOULDER ARTHROSCOPY WITH SUBACROMIAL DECOMPRESSION, ROTATOR CUFF REPAIR ;  Surgeon: Francena HanlyKevin Supple, MD;  Location: [facility] OR;  Service: Orthopedics;  Laterality: Left;    Social History:  reports that he has never smoked. He has never used smokeless tobacco. He reports that he does  not drink alcohol or use drugs.  Allergies:  No Known Allergies  Medications: No current facility-administered medications for this encounter.    Current Outpatient Prescriptions  Medication Sig Dispense Refill  . albuterol (PROVENTIL) (2.5 MG/3ML) 0.083% nebulizer solution Take 3 mLs (2.5 mg total) by nebulization every 4 (four) hours as needed for wheezing. 75 mL   . amitriptyline (ELAVIL) 50 MG tablet Take 50 mg by mouth at bedtime.      Marland Kitchen. amLODipine (NORVASC) 10 MG tablet Take 10 mg by mouth daily.      Marland Kitchen. azelastine (ASTELIN) 137 MCG/SPRAY nasal spray Place 2 sprays into the nose daily as needed for allergies. Use in each nostril as directed    . budesonide-formoterol (SYMBICORT) 160-4.5 MCG/ACT inhaler Take 2 puffs first thing in am and then another 2 puffs about 12 hours later. 1 Inhaler 12  . diazepam (VALIUM) 5 MG tablet Take 0.5-1 tablets (2.5-5 mg total) by mouth every 6 (six) hours as needed for muscle spasms or sedation. 40 tablet 1  . esomeprazole (NEXIUM) 40 MG capsule Take 40 mg by mouth daily before breakfast.      . famotidine (PEPCID) 20 MG tablet One at bedtime (Patient taking differently: Take 20 mg by mouth at bedtime. One at bedtime) 30 tablet 11  . furosemide (LASIX) 20 MG tablet Take 20 mg by mouth See admin instructions. As needed for water retention     .  HYDROcodone-acetaminophen (NORCO) 10-325 MG tablet Take 1-2 tablets by mouth every 4 (four) hours as needed for moderate pain. 50 tablet 0  . HYDROmorphone (DILAUDID) 2 MG tablet Take 1 tablet (2 mg total) by mouth every 4 (four) hours as needed for severe pain (do not use along with norco). 20 tablet 0  . LIVALO 2 MG TABS Take 1 tablet by mouth daily.  0  . meloxicam (MOBIC) 15 MG tablet Take 15 mg by mouth daily.      Marland Kitchen. triamcinolone (NASACORT) 55 MCG/ACT nasal inhaler Place 2 sprays into the nose daily as needed.     Marland Kitchen. ZOLMitriptan (ZOMIG) 2.5 MG tablet Take 5 mg by mouth as needed. For migraine.  May repeat once  after 2 hours.  Max 10mg /day     . zolpidem (AMBIEN) 10 MG tablet Take 10 mg by mouth at bedtime.        No results found for this or any previous visit (from the past 48 hour(s)). No results found.  ROS: Pain with rom of the left lower extremity  Physical Exam:  Alert and oriented 65 y.o. male in no acute distress Cranial nerves 2-12 intact Cervical spine: full rom with no tenderness, nv intact distally Chest: active breath sounds bilaterally, no wheeze rhonchi or rales Heart: regular rate and rhythm, no murmur Abd: non tender non distended with active bowel sounds Hip is stable with rom  Left knee with moderate medial and lateral joint line tenderness nv intact distally No rashes or edema  Assessment/Plan Assessment: left knee end stage osteoarthritis  Plan: Patient will undergo a left total knee arthroplasty [facility]. Risks benefits and expectations were discussed with the patient. Patient understand risks, benefits and expectations and wishes to proceed.

## 2016-08-27 NOTE — Pre-Procedure Instructions (Signed)
Eliyah Leon  08/27/2016      [facility] [place]  - [id number] [place] [address] Phone: [phone] Fax: [phone]    Your procedure is scheduled on January 5  [facility] at 1120 A.M.  Call this number if you have problems the morning of surgery:  [phone]   Remember:  Do not eat food or drink liquids after midnight.   Take these medicines the morning of surgery with A SIP OF WATER albuterol (PROVENTIL), amLODipine (NORVASC), nasal sprays,   budesonide-formoterol (SYMBICORT),  esomeprazole (NEXIUM), famotidine (PEPCID),  HYDROcodone-acetaminophen (NORCO), montelukast (SINGULAIR,   7 days prior to surgery STOP taking any MOBIC Aspirin, Aleve, Naproxen, Ibuprofen, Motrin, Advil, Goody's, BC's, all herbal medications, fish oil, and all vitamins    Do not wear jewelry.  Do not wear lotions, powders, or cologne, or deoderant.   Men may shave face and neck.  Do not bring valuables to the hospital.  [facility] for any belongings or valuables.  Contacts, dentures or bridgework may not be worn into surgery.  Leave your suitcase in the car.  After surgery it may be brought to your room.  For patients admitted to the hospital, discharge time will be determined by your treatment team.  Patients discharged the day of surgery will not be allowed to drive home.   Special instructions:   [place]- Preparing For Surgery  Before surgery, you can play an important role. Because skin is not sterile, your skin needs to be as free of germs as possible. You can reduce the number of germs on your skin by washing with CHG (chlorahexidine gluconate) Soap before surgery.  CHG is an antiseptic cleaner which kills germs and bonds with the skin to continue killing germs even after washing.  Please do not use if you have an allergy to CHG or antibacterial soaps. If your skin  becomes reddened/irritated stop using the CHG.  Do not shave (including legs and underarms) for at least 48 hours prior to first CHG shower. It is OK to shave your face.  Please follow these instructions carefully.   1. Shower the NIGHT BEFORE SURGERY and the MORNING OF SURGERY with CHG.   2. If you chose to wash your hair, wash your hair first as usual with your normal shampoo.  3. After you shampoo, rinse your hair and body thoroughly to remove the shampoo.  4. Use CHG as you would any other liquid soap. You can apply CHG directly to the skin and wash gently with a scrungie or a clean washcloth.   5. Apply the CHG Soap to your body ONLY FROM THE NECK DOWN.  Do not use on open wounds or open sores. Avoid contact with your eyes, ears, mouth and genitals (private parts). Wash genitals (private parts) with your normal soap.  6. Wash thoroughly, paying special attention to the area where your surgery will be performed.  7. Thoroughly rinse your body with warm water from the neck down.  8. DO NOT shower/wash with your normal soap after using and rinsing off the CHG Soap.  9. Pat yourself dry with a CLEAN TOWEL.   10. Wear CLEAN PAJAMAS   11. Place CLEAN SHEETS on your bed the night of your first shower and DO NOT SLEEP WITH PETS.    Day of Surgery: Do not apply any deodorants/lotions. Please wear clean clothes to the  hospital/surgery center.      Please read over the following fact sheets that you were given.

## 2016-08-28 ENCOUNTER — Encounter (HOSPITAL_COMMUNITY)
Admission: RE | Admit: 2016-08-28 | Discharge: 2016-08-28 | Disposition: A | Discharge status: Home / Self Care | Payer: Medicare HMO | Source: Ambulatory Visit | Attending: Orthopedic Surgery | Admitting: Orthopedic Surgery

## 2016-08-30 ENCOUNTER — Encounter (HOSPITAL_COMMUNITY)
Admission: RE | Admit: 2016-08-30 | Discharge: 2016-08-30 | Disposition: A | Discharge status: Home / Self Care | Payer: Medicare HMO | Source: Ambulatory Visit | Attending: Orthopedic Surgery | Admitting: Orthopedic Surgery

## 2016-08-30 ENCOUNTER — Encounter (HOSPITAL_COMMUNITY): Payer: Self-pay

## 2016-08-30 DIAGNOSIS — Z01818 Encounter for other preprocedural examination: Secondary | ICD-10-CM | POA: Insufficient documentation

## 2016-08-30 DIAGNOSIS — M1712 Unilateral primary osteoarthritis, left knee: Secondary | ICD-10-CM | POA: Diagnosis not present

## 2016-08-30 HISTORY — DX: Unspecified cataract: H26.9

## 2016-08-30 HISTORY — DX: Anxiety disorder, unspecified: F41.9

## 2016-08-30 HISTORY — DX: Presence of spectacles and contact lenses: Z97.3

## 2016-08-30 HISTORY — DX: Complete loss of teeth, unspecified cause, unspecified class: K08.109

## 2016-08-30 HISTORY — DX: Complete loss of teeth, unspecified cause, unspecified class: Z97.2

## 2016-08-30 LAB — BASIC METABOLIC PANEL
Anion gap: 9 (ref 5–15)
BUN: 8 mg/dL (ref 6–20)
CHLORIDE: 107 mmol/L (ref 101–111)
CO2: 23 mmol/L (ref 22–32)
CREATININE: 0.93 mg/dL (ref 0.61–1.24)
Calcium: 8.8 mg/dL — ABNORMAL LOW (ref 8.9–10.3)
Glucose, Bld: 86 mg/dL (ref 65–99)
POTASSIUM: 3.4 mmol/L — AB (ref 3.5–5.1)
SODIUM: 139 mmol/L (ref 135–145)

## 2016-08-30 LAB — CBC
HEMATOCRIT: 44.6 % (ref 39.0–52.0)
Hemoglobin: 15.4 g/dL (ref 13.0–17.0)
MCH: 30.3 pg (ref 26.0–34.0)
MCHC: 34.5 g/dL (ref 30.0–36.0)
MCV: 87.6 fL (ref 78.0–100.0)
PLATELETS: 251 10*3/uL (ref 150–400)
RBC: 5.09 MIL/uL (ref 4.22–5.81)
RDW: 13.1 % (ref 11.5–15.5)
WBC: 6.4 10*3/uL (ref 4.0–10.5)

## 2016-08-30 LAB — SURGICAL PCR SCREEN
MRSA, PCR: NEGATIVE
STAPHYLOCOCCUS AUREUS: NEGATIVE

## 2016-08-30 NOTE — Pre-Procedure Instructions (Signed)
    Cormick Vanputten  08/30/2016      [facility] [place] - [id number] [place] [id number] Phone: [phone] Fax: [phone]    Your procedure is scheduled on Friday, September 06, 2016  [facility] at 11:20 A.M.  Call this number if you have problems the morning of surgery:  [phone]   Remember:  Do not eat food or drink liquids after midnight Thursday, September 05, 2016  Take these medicines the morning of surgery with A SIP OF WATER : amLODipine (NORVASC), esomeprazole (NEXIUM), if needed: pain medication, nasal spray, inhalers ( Bring inhalers in with you on day of surgery) Stop taking Aspirin, vitamins, fish oil and herbal medications. Do not take any NSAIDs ie: Ibuprofen, Advil, Naproxen, BC and Goody Powder or any medication containing Aspirin such as meloxicam (MOBIC); stop now.  Do not wear jewelry, make-up or nail polish.  Do not wear lotions, powders, or perfumes, or deoderant.  Do not shave 48 hours prior to surgery.  Men may shave face and neck.  Do not bring valuables to the hospital.  [facility] for any belongings or valuables.  Contacts, dentures or bridgework may not be worn into surgery.  Leave your suitcase in the car.  After surgery it may be brought to your room. For patients admitted to the hospital, discharge time will be determined by your treatment team. Special instructions: Shower the night before surgery and the morning of surgery with CHG. Please read over the following fact sheets that you were given. Pain Booklet, Coughing and Deep Breathing, Total Joint Packet, MRSA Information and Surgical Site Infection Prevention

## 2016-08-30 NOTE — Progress Notes (Signed)
Pt denies SOB and chest pain but is under the care of Dr. Tomie Chinaevankar, Cardiology. Requested EKG, stress, echo, cath and LOV notes from cardiologist. Requested EKG and clearance note from Dr. Shary DecampGrisso, PCP. Pt chart forwarded to anesthesia to review cardiac history.

## 2016-09-03 NOTE — Progress Notes (Signed)
Anesthesia Chart Review: Patient is a 66 year old male scheduled for left TKA on 09/06/16 by Dr. Ranell PatrickNorris.  History includes non-smoker, COPD, HTN, HLD, migraines, OSA (no CPAP), GERD, SOB, hiatal hernia, dysrhythmia (not specified), cholecystectomy, anxiety, left rotator cuff repair '16.  - PCP is Dr. Shary [facility]ampGrisso at [facility]. He saw patient on 07/08/16 and medically cleared him for this procedure.  - Pulmonologist is Dr. Blenda Nicelyhodri.  - He has seen cardiologist Dr. Tomie Chinaevankar ([facility] with [facility]) in the past, but had normal coronaries with [facility].  - [facility] at [facility] at the beginning of 2017 after episodes of feeling dazed/distant over the previous year. Reportedly head CT was negative. EEG was done and was negative for seizures. Episodes were not felt typical of TIAs. Repeat sleep study was ordered, but I don't see any subsequent report in Care Everywhere. (Dr. Anders GrantGrisso's 07/08/16 indicates that patient saw another neurologist and thought symptoms may be related to complicated migraine.)  Meds include albuterol, Elavil, amlodipine, Astelin, Symbicort, Nexium, Lasix, Norco, Hyzaar, Amitiza, Singulair, Movantik, Nasacort, Zomig, Ambien.   BP (!) 160/82 Comment: rechecked notified RN  Pulse 84   Temp 36.6 C   Resp 20   Ht 5\' 11"  (1.803 m)   Wt 213 lb 8 oz (96.8 kg)   SpO2 98%   BMI 29.78 kg/m   EKG 07/08/16: SR.  [facility] 12/15/08 ([facility]; scanned under Media tab, encounter 07/04/11): Normal coronaries, normal LVF.  Last echo seen was from 04/09/02 and was normal other than mild TR. (Scanned under Media tab, encounter 07/04/11.)  EEG 10/04/15 [facility]; Care Everywhere): Interpretation:  This is a normal, routine, adult, awake and drowsy EEG.  Preoperative labs noted. K 3.4, Cr 0.93. CBC WNL. Glucose 86.  If no acute changes then I anticipate that he can proceed as planned.   Velna Ochsllison Zelenak, PA-C [facility]  [facility]/Anesthesiology Phone [phone] 09/03/2016 1:52 PM

## 2016-09-05 MED ORDER — TRANEXAMIC ACID 1000 MG/10ML IV SOLN
1000.0000 mg | INTRAVENOUS | Status: AC
  Administered 2016-09-06: 1000 mg via INTRAVENOUS
  Filled 2016-09-05: qty 10

## 2016-09-06 ENCOUNTER — Inpatient Hospital Stay (HOSPITAL_COMMUNITY): Payer: Medicare HMO | Admitting: Vascular Surgery

## 2016-09-06 ENCOUNTER — Inpatient Hospital Stay (HOSPITAL_COMMUNITY): Payer: Medicare HMO | Admitting: Certified Registered Nurse Anesthetist

## 2016-09-06 ENCOUNTER — Encounter (HOSPITAL_COMMUNITY)
Admission: RE | Disposition: A | Discharge status: Home Health | Payer: Self-pay | Source: Ambulatory Visit | Attending: Orthopedic Surgery

## 2016-09-06 ENCOUNTER — Inpatient Hospital Stay (HOSPITAL_COMMUNITY)
Admission: RE | Admit: 2016-09-06 | Discharge: 2016-09-09 | DRG: 470 | Disposition: A | Discharge status: Home Health | Payer: Medicare HMO | Source: Ambulatory Visit | Attending: Orthopedic Surgery | Admitting: Orthopedic Surgery

## 2016-09-06 ENCOUNTER — Inpatient Hospital Stay (HOSPITAL_COMMUNITY): Payer: Medicare HMO

## 2016-09-06 ENCOUNTER — Encounter (HOSPITAL_COMMUNITY): Payer: Self-pay | Admitting: *Deleted

## 2016-09-06 DIAGNOSIS — K219 Gastro-esophageal reflux disease without esophagitis: Secondary | ICD-10-CM | POA: Diagnosis present

## 2016-09-06 DIAGNOSIS — M25662 Stiffness of left knee, not elsewhere classified: Secondary | ICD-10-CM

## 2016-09-06 DIAGNOSIS — J449 Chronic obstructive pulmonary disease, unspecified: Secondary | ICD-10-CM | POA: Diagnosis present

## 2016-09-06 DIAGNOSIS — R262 Difficulty in walking, not elsewhere classified: Secondary | ICD-10-CM

## 2016-09-06 DIAGNOSIS — Z79899 Other long term (current) drug therapy: Secondary | ICD-10-CM | POA: Diagnosis not present

## 2016-09-06 DIAGNOSIS — F419 Anxiety disorder, unspecified: Secondary | ICD-10-CM | POA: Diagnosis present

## 2016-09-06 DIAGNOSIS — E785 Hyperlipidemia, unspecified: Secondary | ICD-10-CM | POA: Diagnosis present

## 2016-09-06 DIAGNOSIS — M1712 Unilateral primary osteoarthritis, left knee: Secondary | ICD-10-CM | POA: Diagnosis present

## 2016-09-06 DIAGNOSIS — Z96652 Presence of left artificial knee joint: Secondary | ICD-10-CM

## 2016-09-06 DIAGNOSIS — G4730 Sleep apnea, unspecified: Secondary | ICD-10-CM | POA: Diagnosis present

## 2016-09-06 DIAGNOSIS — I10 Essential (primary) hypertension: Secondary | ICD-10-CM | POA: Diagnosis present

## 2016-09-06 DIAGNOSIS — M25562 Pain in left knee: Secondary | ICD-10-CM | POA: Diagnosis present

## 2016-09-06 HISTORY — PX: TOTAL KNEE ARTHROPLASTY: SHX125

## 2016-09-06 LAB — CBC
HEMATOCRIT: 42.9 % (ref 39.0–52.0)
HEMOGLOBIN: 14.3 g/dL (ref 13.0–17.0)
MCH: 29.6 pg (ref 26.0–34.0)
MCHC: 33.3 g/dL (ref 30.0–36.0)
MCV: 88.8 fL (ref 78.0–100.0)
Platelets: 176 10*3/uL (ref 150–400)
RBC: 4.83 MIL/uL (ref 4.22–5.81)
RDW: 13.6 % (ref 11.5–15.5)
WBC: 6.7 10*3/uL (ref 4.0–10.5)

## 2016-09-06 LAB — CREATININE, SERUM: Creatinine, Ser: 1.07 mg/dL (ref 0.61–1.24)

## 2016-09-06 SURGERY — ARTHROPLASTY, KNEE, TOTAL
Anesthesia: Spinal | Laterality: Left

## 2016-09-06 MED ORDER — MIDAZOLAM HCL 2 MG/2ML IJ SOLN: 2.0000 mg | Freq: Once | INTRAMUSCULAR | Status: DC

## 2016-09-06 MED ORDER — OXYCODONE HCL 5 MG PO TABS
5.0000 mg | ORAL_TABLET | ORAL | Status: DC | PRN
  Administered 2016-09-06 – 2016-09-07 (×4): 10 mg via ORAL
  Filled 2016-09-06 (×5): qty 2

## 2016-09-06 MED ORDER — SODIUM CHLORIDE 0.9 % IV SOLN
INTRAVENOUS | Status: DC
  Administered 2016-09-07: 02:00:00 via INTRAVENOUS

## 2016-09-06 MED ORDER — METHOCARBAMOL 500 MG PO TABS: 500.0000 mg | ORAL_TABLET | Freq: Three times a day (TID) | ORAL | 1 refills | Status: DC | PRN

## 2016-09-06 MED ORDER — HYDROCHLOROTHIAZIDE 12.5 MG PO CAPS
12.5000 mg | ORAL_CAPSULE | Freq: Every day | ORAL | Status: DC
  Administered 2016-09-07 – 2016-09-09 (×2): 12.5 mg via ORAL
  Filled 2016-09-06 (×3): qty 1

## 2016-09-06 MED ORDER — OXYCODONE HCL 5 MG PO TABS: 5.0000 mg | ORAL_TABLET | Freq: Once | ORAL | Status: DC | PRN

## 2016-09-06 MED ORDER — OXYCODONE-ACETAMINOPHEN 5-325 MG PO TABS: 1.0000 | ORAL_TABLET | ORAL | 0 refills | Status: DC | PRN

## 2016-09-06 MED ORDER — SUMATRIPTAN SUCCINATE 100 MG PO TABS
100.0000 mg | ORAL_TABLET | ORAL | Status: DC | PRN
  Administered 2016-09-09: 100 mg via ORAL
  Filled 2016-09-06 (×2): qty 1

## 2016-09-06 MED ORDER — ENOXAPARIN SODIUM 40 MG/0.4ML ~~LOC~~ SOLN: 40.0000 mg | SUBCUTANEOUS | 0 refills | Status: DC

## 2016-09-06 MED ORDER — METOCLOPRAMIDE HCL 5 MG/ML IJ SOLN: 5.0000 mg | Freq: Three times a day (TID) | INTRAMUSCULAR | Status: DC | PRN

## 2016-09-06 MED ORDER — HYDROMORPHONE HCL 2 MG/ML IJ SOLN
1.0000 mg | INTRAMUSCULAR | Status: DC | PRN
  Administered 2016-09-06: 2 mg via INTRAVENOUS
  Administered 2016-09-06: 1 mg via INTRAVENOUS
  Administered 2016-09-06: 2 mg via INTRAVENOUS
  Administered 2016-09-07: 1 mg via INTRAVENOUS
  Administered 2016-09-07: 2 mg via INTRAVENOUS
  Administered 2016-09-07: 1 mg via INTRAVENOUS
  Filled 2016-09-06 (×6): qty 1

## 2016-09-06 MED ORDER — FLUTICASONE PROPIONATE 50 MCG/ACT NA SUSP: 2.0000 | Freq: Every day | NASAL | Status: DC | PRN

## 2016-09-06 MED ORDER — WARFARIN - PHARMACIST DOSING INPATIENT
Freq: Every day | Status: DC
  Administered 2016-09-08: 18:00:00

## 2016-09-06 MED ORDER — WARFARIN VIDEO: Freq: Once | Status: DC

## 2016-09-06 MED ORDER — FENTANYL CITRATE (PF) 100 MCG/2ML IJ SOLN: 50.0000 ug | Freq: Once | INTRAMUSCULAR | Status: DC

## 2016-09-06 MED ORDER — MOMETASONE FURO-FORMOTEROL FUM 200-5 MCG/ACT IN AERO
2.0000 | INHALATION_SPRAY | Freq: Two times a day (BID) | RESPIRATORY_TRACT | Status: DC
  Administered 2016-09-08 – 2016-09-09 (×3): 2 via RESPIRATORY_TRACT
  Filled 2016-09-06 (×2): qty 8.8

## 2016-09-06 MED ORDER — MONTELUKAST SODIUM 10 MG PO TABS
10.0000 mg | ORAL_TABLET | Freq: Every day | ORAL | Status: DC
Start: 2016-09-06 — End: 2016-09-09
  Administered 2016-09-06 – 2016-09-08 (×3): 10 mg via ORAL
  Filled 2016-09-06 (×3): qty 1

## 2016-09-06 MED ORDER — LACTATED RINGERS IV SOLN
INTRAVENOUS | Status: DC
  Administered 2016-09-06 (×2): via INTRAVENOUS

## 2016-09-06 MED ORDER — CHLORHEXIDINE GLUCONATE 4 % EX LIQD: 60.0000 mL | Freq: Once | CUTANEOUS | Status: DC

## 2016-09-06 MED ORDER — MENTHOL 3 MG MT LOZG
1.0000 | LOZENGE | OROMUCOSAL | Status: DC | PRN
Start: 2016-09-06 — End: 2016-09-09

## 2016-09-06 MED ORDER — ZOLPIDEM TARTRATE 5 MG PO TABS
5.0000 mg | ORAL_TABLET | Freq: Every day | ORAL | Status: DC
Start: 2016-09-06 — End: 2016-09-09
  Administered 2016-09-06 – 2016-09-08 (×3): 5 mg via ORAL
  Filled 2016-09-06 (×3): qty 1

## 2016-09-06 MED ORDER — PROPOFOL 500 MG/50ML IV EMUL
INTRAVENOUS | Status: DC | PRN
  Administered 2016-09-06: 100 ug/kg/min via INTRAVENOUS

## 2016-09-06 MED ORDER — PHENYLEPHRINE HCL 10 MG/ML IJ SOLN
INTRAVENOUS | Status: DC | PRN
  Administered 2016-09-06: 10 ug/min via INTRAVENOUS

## 2016-09-06 MED ORDER — ATORVASTATIN CALCIUM 10 MG PO TABS
10.0000 mg | ORAL_TABLET | Freq: Every day | ORAL | Status: DC
Start: 2016-09-06 — End: 2016-09-09
  Administered 2016-09-06 – 2016-09-08 (×3): 10 mg via ORAL
  Filled 2016-09-06 (×3): qty 1

## 2016-09-06 MED ORDER — LOSARTAN POTASSIUM 50 MG PO TABS
100.0000 mg | ORAL_TABLET | Freq: Every day | ORAL | Status: DC
  Administered 2016-09-07 – 2016-09-09 (×2): 100 mg via ORAL
  Filled 2016-09-06 (×3): qty 2

## 2016-09-06 MED ORDER — PROPOFOL 10 MG/ML IV BOLUS
INTRAVENOUS | Status: AC
  Filled 2016-09-06: qty 20

## 2016-09-06 MED ORDER — FENTANYL CITRATE (PF) 100 MCG/2ML IJ SOLN
INTRAMUSCULAR | Status: AC
  Administered 2016-09-06: 50 ug
  Filled 2016-09-06: qty 2

## 2016-09-06 MED ORDER — ONDANSETRON HCL 4 MG PO TABS: 4.0000 mg | ORAL_TABLET | Freq: Four times a day (QID) | ORAL | Status: DC | PRN

## 2016-09-06 MED ORDER — MELOXICAM 7.5 MG PO TABS
15.0000 mg | ORAL_TABLET | Freq: Every day | ORAL | Status: DC
  Administered 2016-09-07 – 2016-09-09 (×3): 15 mg via ORAL
  Filled 2016-09-06 (×3): qty 2

## 2016-09-06 MED ORDER — SODIUM CHLORIDE 0.9 % IR SOLN
Status: DC | PRN
  Administered 2016-09-06: 3000 mL

## 2016-09-06 MED ORDER — WARFARIN SODIUM 5 MG PO TABS
5.0000 mg | ORAL_TABLET | Freq: Once | ORAL | Status: AC
  Administered 2016-09-06: 5 mg via ORAL
  Filled 2016-09-06: qty 1

## 2016-09-06 MED ORDER — ALBUMIN HUMAN 5 % IV SOLN
INTRAVENOUS | Status: DC | PRN
  Administered 2016-09-06: 15:00:00 via INTRAVENOUS

## 2016-09-06 MED ORDER — AMITRIPTYLINE HCL 50 MG PO TABS
50.0000 mg | ORAL_TABLET | Freq: Every day | ORAL | Status: DC
  Administered 2016-09-06 – 2016-09-08 (×3): 50 mg via ORAL
  Filled 2016-09-06 (×3): qty 1

## 2016-09-06 MED ORDER — ACETAMINOPHEN 650 MG RE SUPP: 650.0000 mg | Freq: Four times a day (QID) | RECTAL | Status: DC | PRN

## 2016-09-06 MED ORDER — LOSARTAN POTASSIUM-HCTZ 100-12.5 MG PO TABS: 1.0000 | ORAL_TABLET | Freq: Every day | ORAL | Status: DC

## 2016-09-06 MED ORDER — FENTANYL CITRATE (PF) 100 MCG/2ML IJ SOLN
INTRAMUSCULAR | Status: AC
  Filled 2016-09-06: qty 2

## 2016-09-06 MED ORDER — PANTOPRAZOLE SODIUM 40 MG PO TBEC
80.0000 mg | DELAYED_RELEASE_TABLET | Freq: Every day | ORAL | Status: DC
  Administered 2016-09-07 – 2016-09-09 (×3): 80 mg via ORAL
  Filled 2016-09-06 (×3): qty 2

## 2016-09-06 MED ORDER — ONDANSETRON HCL 4 MG/2ML IJ SOLN: 4.0000 mg | Freq: Four times a day (QID) | INTRAMUSCULAR | Status: DC | PRN

## 2016-09-06 MED ORDER — METHOCARBAMOL 500 MG PO TABS
500.0000 mg | ORAL_TABLET | Freq: Four times a day (QID) | ORAL | Status: DC | PRN
  Administered 2016-09-06 – 2016-09-07 (×2): 500 mg via ORAL
  Filled 2016-09-06 (×2): qty 1

## 2016-09-06 MED ORDER — OXYCODONE HCL 5 MG/5ML PO SOLN: 5.0000 mg | Freq: Once | ORAL | Status: DC | PRN

## 2016-09-06 MED ORDER — METOCLOPRAMIDE HCL 5 MG PO TABS: 5.0000 mg | ORAL_TABLET | Freq: Three times a day (TID) | ORAL | Status: DC | PRN

## 2016-09-06 MED ORDER — BUPIVACAINE IN DEXTROSE 0.75-8.25 % IT SOLN
INTRATHECAL | Status: DC | PRN
  Administered 2016-09-06: 1.8 mL via INTRATHECAL

## 2016-09-06 MED ORDER — AZELASTINE HCL 0.1 % NA SOLN: 2.0000 | Freq: Every day | NASAL | Status: DC | PRN

## 2016-09-06 MED ORDER — HYDROMORPHONE HCL 1 MG/ML IJ SOLN: 0.2500 mg | INTRAMUSCULAR | Status: DC | PRN

## 2016-09-06 MED ORDER — DOCUSATE SODIUM 100 MG PO CAPS
100.0000 mg | ORAL_CAPSULE | Freq: Two times a day (BID) | ORAL | Status: DC
  Administered 2016-09-06 – 2016-09-09 (×6): 100 mg via ORAL
  Filled 2016-09-06 (×6): qty 1

## 2016-09-06 MED ORDER — ALBUTEROL SULFATE (2.5 MG/3ML) 0.083% IN NEBU: 2.5000 mg | INHALATION_SOLUTION | RESPIRATORY_TRACT | Status: DC | PRN

## 2016-09-06 MED ORDER — MIDAZOLAM HCL 2 MG/2ML IJ SOLN
INTRAMUSCULAR | Status: AC
  Administered 2016-09-06: 2 mg
  Filled 2016-09-06: qty 2

## 2016-09-06 MED ORDER — PHENOL 1.4 % MT LIQD: 1.0000 | OROMUCOSAL | Status: DC | PRN

## 2016-09-06 MED ORDER — NALOXEGOL OXALATE 25 MG PO TABS
25.0000 mg | ORAL_TABLET | Freq: Every day | ORAL | Status: DC
  Administered 2016-09-07 – 2016-09-09 (×3): 25 mg via ORAL
  Filled 2016-09-06 (×3): qty 1

## 2016-09-06 MED ORDER — METHOCARBAMOL 1000 MG/10ML IJ SOLN
500.0000 mg | Freq: Four times a day (QID) | INTRAVENOUS | Status: DC | PRN
  Filled 2016-09-06 (×2): qty 5

## 2016-09-06 MED ORDER — POLYETHYLENE GLYCOL 3350 17 G PO PACK: 17.0000 g | PACK | Freq: Every day | ORAL | Status: DC | PRN

## 2016-09-06 MED ORDER — ACETAMINOPHEN 325 MG PO TABS
650.0000 mg | ORAL_TABLET | Freq: Four times a day (QID) | ORAL | Status: DC | PRN
  Administered 2016-09-07 (×2): 650 mg via ORAL
  Filled 2016-09-06 (×2): qty 2

## 2016-09-06 MED ORDER — FERROUS SULFATE 325 (65 FE) MG PO TABS
325.0000 mg | ORAL_TABLET | Freq: Three times a day (TID) | ORAL | Status: DC
  Administered 2016-09-06 – 2016-09-09 (×6): 325 mg via ORAL
  Filled 2016-09-06 (×6): qty 1

## 2016-09-06 MED ORDER — FENTANYL CITRATE (PF) 100 MCG/2ML IJ SOLN
INTRAMUSCULAR | Status: DC | PRN
  Administered 2016-09-06 (×2): 50 ug via INTRAVENOUS

## 2016-09-06 MED ORDER — CEFAZOLIN SODIUM-DEXTROSE 2-4 GM/100ML-% IV SOLN
2.0000 g | Freq: Four times a day (QID) | INTRAVENOUS | Status: AC
  Administered 2016-09-06 – 2016-09-07 (×2): 2 g via INTRAVENOUS
  Filled 2016-09-06 (×2): qty 100

## 2016-09-06 MED ORDER — 0.9 % SODIUM CHLORIDE (POUR BTL) OPTIME
TOPICAL | Status: DC | PRN
  Administered 2016-09-06: 1000 mL

## 2016-09-06 MED ORDER — CEFAZOLIN SODIUM-DEXTROSE 2-4 GM/100ML-% IV SOLN
2.0000 g | INTRAVENOUS | Status: AC
  Administered 2016-09-06: 2 g via INTRAVENOUS
  Filled 2016-09-06: qty 100

## 2016-09-06 MED ORDER — BISACODYL 10 MG RE SUPP: 10.0000 mg | Freq: Every day | RECTAL | Status: DC | PRN

## 2016-09-06 MED ORDER — ENOXAPARIN SODIUM 30 MG/0.3ML ~~LOC~~ SOLN
30.0000 mg | Freq: Two times a day (BID) | SUBCUTANEOUS | Status: DC
  Administered 2016-09-07 – 2016-09-09 (×5): 30 mg via SUBCUTANEOUS
  Filled 2016-09-06 (×5): qty 0.3

## 2016-09-06 MED ORDER — LUBIPROSTONE 8 MCG PO CAPS
8.0000 ug | ORAL_CAPSULE | Freq: Every day | ORAL | Status: DC
  Administered 2016-09-08 – 2016-09-09 (×2): 8 ug via ORAL
  Filled 2016-09-06 (×3): qty 1

## 2016-09-06 MED ORDER — EPHEDRINE SULFATE-NACL 50-0.9 MG/10ML-% IV SOSY
PREFILLED_SYRINGE | INTRAVENOUS | Status: DC | PRN
  Administered 2016-09-06 (×3): 5 mg via INTRAVENOUS

## 2016-09-06 MED ORDER — COUMADIN BOOK
Freq: Once | Status: DC
  Filled 2016-09-06: qty 1

## 2016-09-06 MED ORDER — AMLODIPINE BESYLATE 10 MG PO TABS
10.0000 mg | ORAL_TABLET | Freq: Every day | ORAL | Status: DC
  Administered 2016-09-07 – 2016-09-09 (×2): 10 mg via ORAL
  Filled 2016-09-06 (×3): qty 1

## 2016-09-06 MED ORDER — WARFARIN SODIUM 5 MG PO TABS: 5.0000 mg | ORAL_TABLET | Freq: Every day | ORAL | 0 refills | Status: DC

## 2016-09-06 MED ORDER — TRANEXAMIC ACID 1000 MG/10ML IV SOLN
2000.0000 mg | INTRAVENOUS | Status: AC
  Administered 2016-09-06: 2000 mg via TOPICAL
  Filled 2016-09-06: qty 20

## 2016-09-06 MED ORDER — FUROSEMIDE 20 MG PO TABS: 20.0000 mg | ORAL_TABLET | Freq: Every day | ORAL | Status: DC | PRN

## 2016-09-06 SURGICAL SUPPLY — 67 items
BANDAGE ESMARK 6X9 LF (GAUZE/BANDAGES/DRESSINGS) ×1 IMPLANT
BLADE SAG 18X100X1.27 (BLADE) ×3 IMPLANT
BLADE SAW SGTL 13X75X1.27 (BLADE) ×3 IMPLANT
BLADE SAW SGTL 18X1.27X75 (BLADE) ×2 IMPLANT
BLADE SAW SGTL 18X1.27X75MM (BLADE) ×1
BNDG ELASTIC 6X10 VLCR STRL LF (GAUZE/BANDAGES/DRESSINGS) ×3 IMPLANT
BNDG ESMARK 6X9 LF (GAUZE/BANDAGES/DRESSINGS) ×3
BNDG GAUZE ELAST 4 BULKY (GAUZE/BANDAGES/DRESSINGS) ×3 IMPLANT
BOWL SMART MIX CTS (DISPOSABLE) ×3 IMPLANT
CAP KNEE TOTAL 3 SIGMA ×3 IMPLANT
CEMENT HV SMART SET (Cement) ×6 IMPLANT
CLOSURE WOUND 1/2 X4 (GAUZE/BANDAGES/DRESSINGS) ×2
COVER SURGICAL LIGHT HANDLE (MISCELLANEOUS) ×3 IMPLANT
CUFF TOURNIQUET SINGLE 34IN LL (TOURNIQUET CUFF) ×3 IMPLANT
CUFF TOURNIQUET SINGLE 44IN (TOURNIQUET CUFF) IMPLANT
DRAPE HALF SHEET 40X57 (DRAPES) ×3 IMPLANT
DRAPE IMP U-DRAPE 54X76 (DRAPES) ×3 IMPLANT
DRAPE PROXIMA HALF (DRAPES) ×3 IMPLANT
DRAPE U-SHAPE 47X51 STRL (DRAPES) ×3 IMPLANT
DRSG ADAPTIC 3X8 NADH LF (GAUZE/BANDAGES/DRESSINGS) ×3 IMPLANT
DRSG PAD ABDOMINAL 8X10 ST (GAUZE/BANDAGES/DRESSINGS) ×6 IMPLANT
DURAPREP 26ML APPLICATOR (WOUND CARE) ×3 IMPLANT
ELECT CAUTERY BLADE 6.4 (BLADE) ×3 IMPLANT
ELECT REM PT RETURN 9FT ADLT (ELECTROSURGICAL) ×3
ELECTRODE REM PT RTRN 9FT ADLT (ELECTROSURGICAL) ×1 IMPLANT
GAUZE SPONGE 4X4 12PLY STRL (GAUZE/BANDAGES/DRESSINGS) ×3 IMPLANT
GLOVE BIO SURGEON STRL SZ7 (GLOVE) ×3 IMPLANT
GLOVE BIOGEL PI IND STRL 7.0 (GLOVE) ×1 IMPLANT
GLOVE BIOGEL PI INDICATOR 7.0 (GLOVE) ×2
GLOVE BIOGEL PI ORTHO PRO 7.5 (GLOVE) ×2
GLOVE BIOGEL PI ORTHO PRO SZ7 (GLOVE) ×2
GLOVE BIOGEL PI ORTHO PRO SZ8 (GLOVE) ×2
GLOVE ORTHO TXT STRL SZ7.5 (GLOVE) ×3 IMPLANT
GLOVE PI ORTHO PRO STRL 7.5 (GLOVE) ×1 IMPLANT
GLOVE PI ORTHO PRO STRL SZ7 (GLOVE) ×1 IMPLANT
GLOVE PI ORTHO PRO STRL SZ8 (GLOVE) ×1 IMPLANT
GLOVE SURG ORTHO 8.5 STRL (GLOVE) ×3 IMPLANT
GLOVE SURG SS PI 6.5 STRL IVOR (GLOVE) ×3 IMPLANT
GLOVE SURG SS PI 7.0 STRL IVOR (GLOVE) ×3 IMPLANT
GOWN STRL REUS W/ TWL XL LVL3 (GOWN DISPOSABLE) ×3 IMPLANT
GOWN STRL REUS W/TWL XL LVL3 (GOWN DISPOSABLE) ×6
HANDPIECE INTERPULSE COAX TIP (DISPOSABLE) ×2
IMMOBILIZER KNEE 22 UNIV (SOFTGOODS) ×3 IMPLANT
KIT BASIN OR (CUSTOM PROCEDURE TRAY) ×3 IMPLANT
KIT MANIFOLD (MISCELLANEOUS) ×3 IMPLANT
KIT ROOM TURNOVER OR (KITS) ×3 IMPLANT
MANIFOLD NEPTUNE II (INSTRUMENTS) ×3 IMPLANT
NS IRRIG 1000ML POUR BTL (IV SOLUTION) ×3 IMPLANT
PACK TOTAL JOINT (CUSTOM PROCEDURE TRAY) ×3 IMPLANT
PACK UNIVERSAL I (CUSTOM PROCEDURE TRAY) ×3 IMPLANT
PAD ARMBOARD 7.5X6 YLW CONV (MISCELLANEOUS) ×6 IMPLANT
SET HNDPC FAN SPRY TIP SCT (DISPOSABLE) ×1 IMPLANT
STRIP CLOSURE SKIN 1/2X4 (GAUZE/BANDAGES/DRESSINGS) ×4 IMPLANT
SUCTION FRAZIER HANDLE 10FR (MISCELLANEOUS) ×2
SUCTION TUBE FRAZIER 10FR DISP (MISCELLANEOUS) ×1 IMPLANT
SUT MNCRL AB 3-0 PS2 18 (SUTURE) ×3 IMPLANT
SUT VIC AB 0 CT1 27 (SUTURE) ×4
SUT VIC AB 0 CT1 27XBRD ANBCTR (SUTURE) ×2 IMPLANT
SUT VIC AB 1 CT1 27 (SUTURE) ×6
SUT VIC AB 1 CT1 27XBRD ANBCTR (SUTURE) ×3 IMPLANT
SUT VIC AB 2-0 CT1 27 (SUTURE) ×4
SUT VIC AB 2-0 CT1 TAPERPNT 27 (SUTURE) ×2 IMPLANT
TOWEL OR 17X24 6PK STRL BLUE (TOWEL DISPOSABLE) ×3 IMPLANT
TOWEL OR 17X26 10 PK STRL BLUE (TOWEL DISPOSABLE) ×3 IMPLANT
TRAY FOLEY CATH 16FRSI W/METER (SET/KITS/TRAYS/PACK) ×3 IMPLANT
WATER STERILE IRR 1000ML POUR (IV SOLUTION) IMPLANT
YANKAUER SUCT BULB TIP NO VENT (SUCTIONS) ×3 IMPLANT

## 2016-09-06 NOTE — Progress Notes (Signed)
ANTICOAGULATION CONSULT NOTE - Initial Consult  Pharmacy Consult for warfarin Indication: VTE prophylaxis  No Known Allergies  Patient Measurements: Height: 5\' 11"  (180.3 cm) Weight: 213 lb 8 oz (96.8 kg) IBW/kg (Calculated) : 75.3  Vital Signs: Temp: 98.4 F (36.9 C) (01/05 1855) Temp Source: Oral (01/05 1855) BP: 122/66 (01/05 1855) Pulse Rate: 69 (01/05 1855)  Labs: No results for input(s): HGB, HCT, PLT, APTT, LABPROT, INR, HEPARINUNFRC, HEPRLOWMOCWT, CREATININE, CKTOTAL, CKMB, TROPONINI in the last 72 hours.  Estimated Creatinine Clearance: 92.7 mL/min (by C-G formula based on SCr of 0.93 mg/dL).   Medical History: Past Medical History:  Diagnosis Date  . Anxiety   . Bursitis   . Chronic sinusitis   . COPD (chronic obstructive pulmonary disease) (HCC)   . Dysrhythmia   . Early cataracts, bilateral   . Full dentures   . GERD (gastroesophageal reflux disease)   . History of bronchitis   . History of hiatal hernia   . History of pneumonia   . Hyperlipidemia   . Hypertension   . Migraine   . Osteoarthritis   . Plantar fasciitis   . Shortness of breath dyspnea   . Sleep apnea    pt. refuses CPAP  . Urinary frequency   . Vitamin D deficiency   . Wears glasses      Assessment: 65yom s/p L TKA.  preop CBC ok, INR 1, plan to use warfarin for VTE px.    Goal of Therapy:  INR 2-3 Monitor platelets by anticoagulation protocol: Yes   Plan:  Warfarin 5mg  x1 Daily INR   Leota SauersLisa Lind Ausley Pharm.D. CPP, BCPS Clinical Pharmacist [phone] 09/06/2016 7:00 PM

## 2016-09-06 NOTE — Discharge Instructions (Signed)
Ice to the knee as much as you can.  Elevate the left leg on a pillow under the ankle NOT the knee  Wear the knee immobilizer at night to keep knee straight.  Do exercises every hour at home/    Keep incision clean and dry and covered for one week, then ok to shower.  Follow up with Dr Ranell PatrickNorris in the office in two weeks  [phone]

## 2016-09-06 NOTE — Anesthesia Procedure Notes (Signed)
Spinal  Patient location during procedure: OR Start time: 09/06/2016 2:34 PM End time: 09/06/2016 2:39 PM Staffing Anesthesiologist: Chaney MallingHODIERNE, ADAM Performed: anesthesiologist  Preanesthetic Checklist Completed: patient identified, site marked, surgical consent, pre-op evaluation, timeout performed, IV checked, risks and benefits discussed and monitors and equipment checked Spinal Block Patient position: sitting Prep: DuraPrep Patient monitoring: heart rate, continuous pulse ox, cardiac monitor and blood pressure Approach: midline Location: L3-4 Injection technique: single-shot Needle Needle type: Pencan  Needle gauge: 24 G Needle length: 9 cm Assessment Sensory level: T8 Additional Notes Pt tolerated the proc3edure well.

## 2016-09-06 NOTE — Progress Notes (Signed)
Orthopedic Tech Progress Note Patient Details:  Ethan Harper 1951-04-28 [id number]  CPM Left Knee CPM Left Knee: On Left Knee Flexion (Degrees): 90 Left Knee Extension (Degrees): 0 Additional Comments: foot roll   Ethan Harper 09/06/2016, 5:25 PM

## 2016-09-06 NOTE — Anesthesia Procedure Notes (Signed)
Procedure Name: MAC Date/Time: 09/06/2016 2:45 PM Performed by: Willeen Cass P Pre-anesthesia Checklist: Patient identified, Emergency Drugs available, Suction available and Patient being monitored Patient Re-evaluated:Patient Re-evaluated prior to inductionOxygen Delivery Method: Simple face mask Intubation Type: IV induction Placement Confirmation: positive ETCO2 Dental Injury: Teeth and Oropharynx as per pre-operative assessment

## 2016-09-06 NOTE — Brief Op Note (Signed)
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  PRE-OPERATIVE DIAGNOSIS:  Left knee end stage osteoarthritis  POST-OPERATIVE DIAGNOSIS:  Left knee end stage osteoarthritis  PROCEDURE:  Procedure(s): TOTAL KNEE ARTHROPLASTY (Left) DePuy Sigma RP  SURGEON:  Surgeon(s) and Role:    * Beverely LowSteve Berenis Corter, MD - Primary  PHYSICIAN ASSISTANT:Thomas B Dixon, PA-C  ASSISTANTS: Thea Gisthomas B Dixon, PA-C   ANESTHESIA:   Femoral block and Spinal  EBL:  Total I/O In: 1250 [I.V.:1000; IV Piggyback:250] Out: 150 [Urine:100; Blood:50]  BLOOD ADMINISTERED:none  DRAINS: none   LOCAL MEDICATIONS USED:  NONE    SPECIMEN:  No Specimen  DISPOSITION OF SPECIMEN:  N/A  COUNTS:  YES  TOURNIQUET:   Total Tourniquet Time Documented: Thigh (Left) - 96 minutes Total: Thigh (Left) - 96 minutes   DICTATION: .Other Dictation: Dictation Number [id number]  PLAN OF CARE: Admit to inpatient   PATIENT DISPOSITION:  PACU - hemodynamically stable.   Delay start of Pharmacological VTE agent (>24hrs) due to surgical blood loss or risk of bleeding: no

## 2016-09-06 NOTE — Interval H&P Note (Signed)
History and Physical Interval Note:  09/06/2016 2:11 PM  Ethan Harper  has presented today for surgery, with the diagnosis of Left knee end stage osteoarthritis  The various methods of treatment have been discussed with the patient and family. After consideration of risks, benefits and other options for treatment, the patient has consented to  Procedure(s): TOTAL KNEE ARTHROPLASTY (Left) as a surgical intervention .  The patient's history has been reviewed, patient examined, no change in status, stable for surgery.  I have reviewed the patient's chart and labs.  Questions were answered to the patient's satisfaction.     Cyndy Braver,STEVEN R

## 2016-09-06 NOTE — Anesthesia Preprocedure Evaluation (Addendum)
Anesthesia Evaluation  Patient identified by MRN, date of birth, ID band Patient awake    Reviewed: Allergy & Precautions, NPO status , Patient's Chart, lab work & pertinent test results  Airway Mallampati: II  TM Distance: >3 FB Neck ROM: Full    Dental  (+) Dental Advisory Given, Edentulous Upper, Edentulous Lower   Pulmonary asthma , sleep apnea , COPD,  COPD inhaler,    breath sounds clear to auscultation       Cardiovascular hypertension, Pt. on medications + dysrhythmias  Rhythm:Regular Rate:Normal     Neuro/Psych  Headaches, PSYCHIATRIC DISORDERS Anxiety    GI/Hepatic Neg liver ROS, hiatal hernia, GERD  Medicated,  Endo/Other  negative endocrine ROS  Renal/GU negative Renal ROS  negative genitourinary   Musculoskeletal  (+) Arthritis , Osteoarthritis,    Abdominal (+) + obese,   Peds negative pediatric ROS (+)  Hematology negative hematology ROS (+)   Anesthesia Other Findings   Reproductive/Obstetrics negative OB ROS                            Lab Results  Component Value Date   WBC 6.4 08/30/2016   HGB 15.4 08/30/2016   HCT 44.6 08/30/2016   MCV 87.6 08/30/2016   PLT 251 08/30/2016   Lab Results  Component Value Date   CREATININE 0.93 08/30/2016   BUN 8 08/30/2016   NA 139 08/30/2016   K 3.4 (L) 08/30/2016   CL 107 08/30/2016   CO2 23 08/30/2016   Lab Results  Component Value Date   INR 1.00 06/06/2015   INR 0.93 06/28/2011   EKG: normal sinus rhythm.  Anesthesia Physical Anesthesia Plan  ASA: III  Anesthesia Plan: Spinal   Post-op Pain Management:  Regional for Post-op pain   Induction: Intravenous  Airway Management Planned: Natural Airway and Mask  Additional Equipment:   Intra-op Plan:   Post-operative Plan:   Informed Consent: I have reviewed the patients History and Physical, chart, labs and discussed the procedure including the risks,  benefits and alternatives for the proposed anesthesia with the patient or authorized representative who has indicated his/her understanding and acceptance.     Plan Discussed with: CRNA  Anesthesia Plan Comments:         Anesthesia Quick Evaluation

## 2016-09-06 NOTE — Anesthesia Procedure Notes (Signed)
Anesthesia Regional Block:  Adductor canal block  Pre-Anesthetic Checklist: ,, timeout performed, Correct Patient, Correct Site, Correct Laterality, Correct Procedure, Correct Position, site marked, Risks and benefits discussed,  Surgical consent,  Pre-op evaluation,  At surgeon's request and post-op pain management  Laterality: Left  Prep: chloraprep       Needles:  Injection technique: Single-shot  Needle Type: Echogenic Needle     Needle Length: 9cm 9 cm Needle Gauge: 21 and 21 G    Additional Needles:  Procedures: ultrasound guided (picture in chart) Adductor canal block Narrative:  Start time: 09/06/2016 1:10 PM End time: 09/06/2016 1:15 PM Injection made incrementally with aspirations every 5 mL.  Performed by: Personally  Anesthesiologist: Shona SimpsonHOLLIS, Edwen Mclester D  Additional Notes: Pt tolerated well.

## 2016-09-06 NOTE — Transfer of Care (Signed)
Immediate Anesthesia Transfer of Care Note  Patient: Teacher, English as a foreign languageColumbus Whitehead  Procedure(s) Performed: Procedure(s): TOTAL KNEE ARTHROPLASTY (Left)  Patient Location: PACU  Anesthesia Type:Spinal  Level of Consciousness: awake, alert  and oriented  Airway & Oxygen Therapy: Patient Spontanous Breathing and Patient connected to face mask oxygen  Post-op Assessment: Report given to RN, Post -op Vital signs reviewed and stable and Patient moving all extremities X 4  Post vital signs: Reviewed and stable  Last Vitals:  Vitals:   09/06/16 1320 09/06/16 1325  BP: 140/90 (!) 139/98  Pulse: 90 92  Resp: 15 13  Temp:      Last Pain:  Vitals:   09/06/16 1027  TempSrc:   PainSc: 7          Complications: No apparent anesthesia complications

## 2016-09-07 LAB — CBC
HCT: 43.5 % (ref 39.0–52.0)
Hemoglobin: 14.5 g/dL (ref 13.0–17.0)
MCH: 29.6 pg (ref 26.0–34.0)
MCHC: 33.3 g/dL (ref 30.0–36.0)
MCV: 88.8 fL (ref 78.0–100.0)
PLATELETS: 179 10*3/uL (ref 150–400)
RBC: 4.9 MIL/uL (ref 4.22–5.81)
RDW: 13.8 % (ref 11.5–15.5)
WBC: 7.3 10*3/uL (ref 4.0–10.5)

## 2016-09-07 LAB — BASIC METABOLIC PANEL
ANION GAP: 11 (ref 5–15)
BUN: 6 mg/dL (ref 6–20)
CALCIUM: 8.3 mg/dL — AB (ref 8.9–10.3)
CO2: 24 mmol/L (ref 22–32)
Chloride: 97 mmol/L — ABNORMAL LOW (ref 101–111)
Creatinine, Ser: 0.94 mg/dL (ref 0.61–1.24)
GFR calc Af Amer: >60 mL/min (ref >60)
Glucose, Bld: 205 mg/dL — ABNORMAL HIGH (ref 65–99)
Potassium: 3.3 mmol/L — ABNORMAL LOW (ref 3.5–5.1)
Sodium: 132 mmol/L — ABNORMAL LOW (ref 135–145)

## 2016-09-07 LAB — PROTIME-INR
INR: 1.05
Prothrombin Time: 13.7 seconds (ref 11.4–15.2)

## 2016-09-07 MED ORDER — ACETAMINOPHEN 10 MG/ML IV SOLN
1000.0000 mg | Freq: Four times a day (QID) | INTRAVENOUS | Status: AC
  Administered 2016-09-07 – 2016-09-08 (×4): 1000 mg via INTRAVENOUS
  Filled 2016-09-07 (×4): qty 100

## 2016-09-07 MED ORDER — WARFARIN SODIUM 7.5 MG PO TABS
7.5000 mg | ORAL_TABLET | Freq: Once | ORAL | Status: AC
  Administered 2016-09-07: 7.5 mg via ORAL
  Filled 2016-09-07: qty 1

## 2016-09-07 MED ORDER — OXYCODONE HCL 5 MG PO TABS
15.0000 mg | ORAL_TABLET | ORAL | Status: DC | PRN
  Administered 2016-09-07 – 2016-09-08 (×7): 15 mg via ORAL
  Administered 2016-09-08: 10 mg via ORAL
  Administered 2016-09-08 – 2016-09-09 (×3): 15 mg via ORAL
  Filled 2016-09-07 (×11): qty 3

## 2016-09-07 MED ORDER — MORPHINE SULFATE (PF) 2 MG/ML IV SOLN
1.0000 mg | INTRAVENOUS | Status: DC | PRN
  Administered 2016-09-07 (×2): 2 mg via INTRAVENOUS
  Filled 2016-09-07 (×2): qty 1

## 2016-09-07 MED ORDER — HYDROMORPHONE HCL 2 MG/ML IJ SOLN
1.0000 mg | INTRAMUSCULAR | Status: DC | PRN
  Filled 2016-09-07: qty 1

## 2016-09-07 MED ORDER — DIAZEPAM 2 MG PO TABS
2.0000 mg | ORAL_TABLET | Freq: Four times a day (QID) | ORAL | Status: DC | PRN
Start: 2016-09-07 — End: 2016-09-07
  Administered 2016-09-07: 2 mg via ORAL
  Filled 2016-09-07: qty 1

## 2016-09-07 MED ORDER — DIAZEPAM 5 MG PO TABS
2.5000 mg | ORAL_TABLET | Freq: Four times a day (QID) | ORAL | Status: DC | PRN
  Administered 2016-09-07 – 2016-09-08 (×2): 5 mg via ORAL
  Filled 2016-09-07 (×2): qty 1

## 2016-09-07 NOTE — Progress Notes (Signed)
Orthopedic Tech Progress Note Patient Details:  Cecelia ByarsColumbus Blash [birth year] [id number]  Patient ID: Cecelia Byarsolumbus Franckowiak, male   DOB: [birth year], 66 y.o.   MRN: [id number] Pt was in to much pain to get in cpm. Will call when ready.  Trinna PostMartinez, Mlissa Tamayo J 09/07/2016, 6:27 AM

## 2016-09-07 NOTE — Evaluation (Signed)
Physical Therapy Evaluation Patient Details Name: Cecelia ByarsColumbus Prescher MRN: [id number] DOB: 02-08-1951 Today's Date: 09/07/2016   History of Present Illness  Pt is a 66 yo male admitted on 09/06/16 for left TKA. PMH significant for COPD, Dysrhythmia, GERD, HTN, Sleep Apnea.   Clinical Impression  Pt is POD 1 and in increased pain. Pt requires increased time and assistance to safely perform bed mobs, transfers and gait this session with increased c/o pain throughout. Pt is instructed on deep breathing to assist with pain management as he had already had all med's prescribed. Unable to fully assess home environment except that pt lives with his wife and has stairs he will have to negotiate to get into home. Pt will benefit from continued acute skilled PT services in order to address below deficits in order to assist with smooth transition home.     Follow Up Recommendations Home health PT;Supervision for mobility/OOB    Equipment Recommendations  Rolling walker with 5" wheels;3in1 (PT)    Recommendations for Other Services       Precautions / Restrictions Precautions Precautions: Knee Precaution Booklet Issued: Yes (comment) Precaution Comments: Left handout with pt, reviewed no pillow under knee Required Braces or Orthoses: Knee Immobilizer - Left Knee Immobilizer - Left: Other (comment) (when it bed sleeping to maintain good alignment) Restrictions Weight Bearing Restrictions: Yes LLE Weight Bearing: Weight bearing as tolerated      Mobility  Bed Mobility Overal bed mobility: Needs Assistance Bed Mobility: Supine to Sit     Supine to sit: Min assist;HOB elevated     General bed mobility comments: pt utilizing rails to sit EOB and requires assistance with LLE  Transfers Overall transfer level: Needs assistance Equipment used: Rolling walker (2 wheeled) Transfers: Sit to/from Stand Sit to Stand: Mod assist         General transfer comment: Mod A from EOB at trunk due to  apprehension to weight bear through LLE  Ambulation/Gait Ambulation/Gait assistance: Mod assist Ambulation Distance (Feet): 5 Feet Assistive device: Rolling walker (2 wheeled) Gait Pattern/deviations: Step-to pattern;Decreased step length - right;Decreased stance time - left;Antalgic;Trunk flexed Gait velocity: decreased Gait velocity interpretation: Below normal speed for age/gender General Gait Details: Mod A when pt attempts to weight bear through LLE to assist on left side with decreasing weight bearing, cues for sequencing and weight brearing through UE's in order to assist with weight shift  Stairs            Wheelchair Mobility    Modified Rankin (Stroke Patients Only)       Balance Overall balance assessment: Needs assistance Sitting-balance support: Single extremity supported;Feet supported Sitting balance-Leahy Scale: Fair Sitting balance - Comments: sitting EOB holding onto rail   Standing balance support: Bilateral upper extremity supported Standing balance-Leahy Scale: Poor Standing balance comment: relies on Rw for stability                             Pertinent Vitals/Pain Pain Assessment: 0-10 Pain Score: 10-Worst pain ever Pain Location: left knee Pain Descriptors / Indicators: Grimacing;Guarding;Moaning;Sharp Pain Intervention(s): Monitored during session;Limited activity within patient's tolerance;Premedicated before session;Ice applied    Home Living Family/patient expects to be discharged to:: Private residence Living Arrangements: Spouse/significant other Available Help at Discharge: Family;Other (Comment) (unable to fully assess ) Type of Home: House Home Access: Stairs to enter   Entergy CorporationEntrance Stairs-Number of Steps: 3     Additional Comments: Unable to fully assess home  environment, pt was in increased pain.     Prior Function Level of Independence: Independent               Hand Dominance   Dominant Hand: Right     Extremity/Trunk Assessment   Upper Extremity Assessment Upper Extremity Assessment: Defer to OT evaluation    Lower Extremity Assessment Lower Extremity Assessment: LLE deficits/detail LLE Deficits / Details: pt with normal post op pain and weakness. At least 3/5 ankle and 2/5 knee and hip per gross functional assessment       Communication   Communication: No difficulties  Cognition Arousal/Alertness: Awake/alert Behavior During Therapy: WFL for tasks assessed/performed Overall Cognitive Status: Within Functional Limits for tasks assessed                 General Comments: pt in excruciating pain this session    General Comments General comments (skin integrity, edema, etc.): pt has BM and has to have RN assist with cleaning pt prior to moving to chair.     Exercises     Assessment/Plan    PT Assessment Patient needs continued PT services  PT Problem List Decreased strength;Decreased range of motion;Decreased activity tolerance;Decreased balance;Decreased mobility;Pain;Decreased knowledge of use of DME          PT Treatment Interventions DME instruction;Gait training;Stair training;Functional mobility training;Therapeutic activities;Therapeutic exercise;Balance training;Patient/family education    PT Goals (Current goals can be found in the Care Plan section)  Acute Rehab PT Goals Patient Stated Goal: to have less pain PT Goal Formulation: With patient Time For Goal Achievement: 09/14/16 Potential to Achieve Goals: Good    Frequency 7X/week   Barriers to discharge        Co-evaluation               End of Session Equipment Utilized During Treatment: Gait belt Activity Tolerance: Patient limited by pain Patient left: in chair;with call bell/phone within reach Nurse Communication: Mobility status         Time: 4098-1191 PT Time Calculation (min) (ACUTE ONLY): 39 min   Charges:   PT Evaluation $PT Eval Moderate Complexity: 1 Procedure PT  Treatments $Gait Training: 8-22 mins $Therapeutic Activity: 8-22 mins   PT G Codes:        Colin Broach PT, DPT  [phone]  09/07/2016, 10:16 AM

## 2016-09-07 NOTE — Progress Notes (Signed)
Subjective: 1 Day Post-Op Procedure(s) (LRB): TOTAL KNEE ARTHROPLASTY (Left) Patient reports pain as severe.  Taking oral oxy and IV dilaudid as well as robaxin without significant relief.  Objective: Vital signs in last 24 hours: Temp:  [97.9 F (36.6 C)-99.9 F (37.7 C)] 98.6 F (37 C) (01/06 0534) Pulse Rate:  [58-113] 109 (01/06 0534) Resp:  [12-20] 18 (01/06 0534) BP: (111-154)/(59-98) 144/97 (01/06 0534) SpO2:  [93 %-100 %] 93 % (01/06 0534) Weight:  [96.8 kg (213 lb 8 oz)] 96.8 kg (213 lb 8 oz) (01/05 1009)  Intake/Output from previous day: 01/05 0701 - 01/06 0700 In: 1250 [I.V.:1000; IV Piggyback:250] Out: 2050 [Urine:2000; Blood:50] Intake/Output this shift: Total I/O In: -  Out: 175 [Urine:175]   Recent Labs  09/06/16 1921 09/07/16 0537  HGB 14.3 14.5    Recent Labs  09/06/16 1921 09/07/16 0537  WBC 6.7 7.3  RBC 4.83 4.90  HCT 42.9 43.5  PLT 176 179    Recent Labs  09/06/16 1921 09/07/16 0537  NA  --  132*  K  --  3.3*  CL  --  97*  CO2  --  24  BUN  --  6  CREATININE 1.07 0.94  GLUCOSE  --  205*  CALCIUM  --  8.3*    Recent Labs  09/07/16 0537  INR 1.05    PE:  wn wd male in mild distress from pain.  L knee wound dressed and dry.  NVI at left foot.  Assessment/Plan: 1 Day Post-Op Procedure(s) (LRB): TOTAL KNEE ARTHROPLASTY (Left) D/c robaxin.  Start valium for anxiety and spasms.  Continue oxy and dilaudid with tylenol.  PT, OT today.  Toni ArthursHEWITT, Artez Regis 09/07/2016, 9:13 AM

## 2016-09-07 NOTE — Progress Notes (Signed)
Orthopedic Tech Progress Note Patient Details:  Ethan ByarsColumbus Harper October 10, 1950 [id number]  Patient ID: Ethan Byarsolumbus Harper, male   DOB: October 10, 1950, 66 y.o.   MRN: [id number]   [name], [name] 09/07/2016, 2:35 PM Placed pt's lle on cpm @0 -60 degrees @1430 

## 2016-09-07 NOTE — Progress Notes (Addendum)
ANTICOAGULATION CONSULT NOTE  Pharmacy Consult for warfarin Indication: VTE prophylaxis  No Known Allergies  Patient Measurements: Height: 5\' 11"  (180.3 cm) Weight: 213 lb 8 oz (96.8 kg) IBW/kg (Calculated) : 75.3  Vital Signs: Temp: 98.6 F (37 C) (01/06 0534) Temp Source: Oral (01/06 0534) BP: 149/94 (01/06 1108) Pulse Rate: 109 (01/06 0534)  Labs:  Recent Labs  09/06/16 1921 09/07/16 0537  HGB 14.3 14.5  HCT 42.9 43.5  PLT 176 179  LABPROT  --  13.7  INR  --  1.05  CREATININE 1.07 0.94    Estimated Creatinine Clearance: 91.7 mL/min (by C-G formula based on SCr of 0.94 mg/dL).   Assessment: 65yom s/p L TKA.  Pre-op CBC ok. Pharmacy consulted for warfarin for VTE prophylaxis.  INR is subtherapeutic at 1.05. No bleeding noted, post-op CBC normal. Also on Lovenox 30 mg SQ q12h and meloxicam so need to watch for bleeding.  Goal of Therapy:  INR 2-3 Monitor platelets by anticoagulation protocol: Yes   Plan:  Warfarin 7.5 mg PO tonight Daily INR Monitor for s/sx of bleeding Follow-up d/c Lovenox when INR >= 1.8   Loura BackJennifer [place], PharmD, BCPS Clinical Pharmacist Phone for today [phone]- [phone] Main pharmacy - [phone] 09/07/2016 1:02 PM

## 2016-09-07 NOTE — Progress Notes (Signed)
Patient with uncontrolled pain 10/10.  Family concerned.  On-call paged, Andrez GrimeJaclyn Bissell, PA to enter new orders for medication.  Will continue to monitor.

## 2016-09-07 NOTE — Anesthesia Postprocedure Evaluation (Addendum)
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  Procedure(s) Performed: Procedure(s) (LRB): TOTAL KNEE ARTHROPLASTY (Left)  Patient location during evaluation: PACU Anesthesia Type: Spinal Level of consciousness: oriented and awake and alert Pain management: pain level controlled Vital Signs Assessment: post-procedure vital signs reviewed and stable Respiratory status: spontaneous breathing, respiratory function stable and patient connected to nasal cannula oxygen Cardiovascular status: blood pressure returned to baseline and stable Postop Assessment: no headache and no backache Anesthetic complications: no       Last Vitals:  Vitals:   09/07/16 0120 09/07/16 0534  BP: (!) 140/91 (!) 144/97  Pulse: (!) 113 (!) 109  Resp: 20 18  Temp: 37.7 C 37 C    Last Pain:  Vitals:   09/07/16 0657  TempSrc:   PainSc: 7                  Ethyn Schetter S

## 2016-09-07 NOTE — Progress Notes (Signed)
Pt is moaning and not comfortable due to pain. On-call  Pa for MD.[facility] notify [facility] pain med. New order received.

## 2016-09-07 NOTE — Progress Notes (Signed)
OT Cancellation Note  Patient Details Name: Ethan ByarsColumbus Harper MRN: [id number] DOB: 10-04-1950   Cancelled Treatment:    Reason Eval/Treat Not Completed: Pain limiting ability to participate;Other (comment) (10/10 pain). Nursing aware per chart review. Will reattempt OT eval tomorrow.   [name], [name] 09/07/2016, 4:15 PM

## 2016-09-08 LAB — CBC
HCT: 42.5 % (ref 39.0–52.0)
HEMOGLOBIN: 14.2 g/dL (ref 13.0–17.0)
MCH: 29.5 pg (ref 26.0–34.0)
MCHC: 33.4 g/dL (ref 30.0–36.0)
MCV: 88.4 fL (ref 78.0–100.0)
Platelets: 182 10*3/uL (ref 150–400)
RBC: 4.81 MIL/uL (ref 4.22–5.81)
RDW: 13.8 % (ref 11.5–15.5)
WBC: 6.6 10*3/uL (ref 4.0–10.5)

## 2016-09-08 LAB — PROTIME-INR
INR: 1.14
PROTHROMBIN TIME: 14.6 s (ref 11.4–15.2)

## 2016-09-08 MED ORDER — WARFARIN SODIUM 7.5 MG PO TABS
7.5000 mg | ORAL_TABLET | Freq: Once | ORAL | Status: AC
Start: 2016-09-08 — End: 2016-09-08
  Administered 2016-09-08: 7.5 mg via ORAL
  Filled 2016-09-08: qty 1

## 2016-09-08 MED ORDER — SODIUM CHLORIDE 0.9 % IV BOLUS (SEPSIS)
500.0000 mL | Freq: Once | INTRAVENOUS | Status: AC
  Administered 2016-09-08: 500 mL via INTRAVENOUS

## 2016-09-08 NOTE — Progress Notes (Signed)
Orthopedics Progress Note  Subjective: Feeling better today  Objective:  Vitals:   09/07/16 1957 09/08/16 0558  BP: (!) 146/89 106/70  Pulse: 97 95  Resp: 18 18  Temp: 98.2 F (36.8 C) 97.8 F (36.6 C)    General: Awake and alert  Musculoskeletal: left knee incision looks good Aquacel applied, no swelling and good quad set and ankle pumps Neurovascularly intact  Lab Results  Component Value Date   WBC 6.6 09/08/2016   HGB 14.2 09/08/2016   HCT 42.5 09/08/2016   MCV 88.4 09/08/2016   PLT 182 09/08/2016       Component Value Date/Time   NA 132 (L) 09/07/2016 0537   K 3.3 (L) 09/07/2016 0537   CL 97 (L) 09/07/2016 0537   CO2 24 09/07/2016 0537   GLUCOSE 205 (H) 09/07/2016 0537   BUN 6 09/07/2016 0537   CREATININE 0.94 09/07/2016 0537   CALCIUM 8.3 (L) 09/07/2016 0537   GFRNONAA >60 09/07/2016 0537   GFRAA >60 09/07/2016 0537    Lab Results  Component Value Date   INR 1.14 09/08/2016   INR 1.05 09/07/2016   INR 1.00 06/06/2015    Assessment/Plan: POD #2 s/p Procedure(s): TOTAL KNEE ARTHROPLASTY INR still subtherapeutic, continue Lovenox bridge.  OOB , PT, OT D/C planned for tomorrow  Almedia BallsSteven R. Ranell PatrickNorris, MD 09/08/2016 9:17 AM

## 2016-09-08 NOTE — Progress Notes (Signed)
ANTICOAGULATION CONSULT NOTE  Pharmacy Consult for warfarin Indication: VTE prophylaxis  No Known Allergies  Patient Measurements: Height: 5\' 11"  (180.3 cm) Weight: 213 lb 8 oz (96.8 kg) IBW/kg (Calculated) : 75.3  Vital Signs: Temp: 97.8 F (36.6 C) (01/07 0558) Temp Source: Oral (01/07 0558) BP: 106/70 (01/07 0558) Pulse Rate: 95 (01/07 0558)  Labs:  Recent Labs  09/06/16 1921 09/07/16 0537 09/08/16 0319  HGB 14.3 14.5 14.2  HCT 42.9 43.5 42.5  PLT 176 179 182  LABPROT  --  13.7 14.6  INR  --  1.05 1.14  CREATININE 1.07 0.94  --     Estimated Creatinine Clearance: 91.7 mL/min (by C-G formula based on SCr of 0.94 mg/dL).   Assessment: 65yom s/p L TKA.  Pre-op CBC ok. Pharmacy consulted for warfarin for VTE prophylaxis.  INR is subtherapeutic at 1.14 s/p 2 doses. No bleeding noted, post-op CBC normal. Also on Lovenox 30 mg SQ q12h and meloxicam so need to watch for bleeding.  Goal of Therapy:  INR 2-3 Monitor platelets by anticoagulation protocol: Yes   Plan:  Warfarin 7.5 mg PO tonight Daily INR Monitor for s/sx of bleeding Follow-up d/c Lovenox when INR >= 1.8   Loura BackJennifer [place], PharmD, BCPS Clinical Pharmacist Phone for today [phone] Main pharmacy - [phone] 09/08/2016 11:05 AM

## 2016-09-08 NOTE — Progress Notes (Signed)
Notified by OT that patient's heart rate was greater than 200.  Upon arrival to patient's room, heart rate reading in the 220's via the continuous pulse oximeter.  Vital signs obtained with Dinamap, see flowsheet.  Heart rate returned to the 110's.  Oxygen saturation ranged high 80's to low 90's via pulse ox monitor, 2L oxygen reapplied.  Systolic BP in the 80's.    Patient without any symptoms.  Rapid response notified.  On call, Alphonsa OverallBrad Dixon, PA notified.  500cc bolus of NS ordered.  Will continue to monitor.  Continuous pulse ox maintained.  Pain currently 6/10 but drowsy and easily aroused.  Will continue to monitor.

## 2016-09-08 NOTE — Progress Notes (Signed)
Physical Therapy Treatment Patient Details Name: Ethan Harper MRN: [id number] DOB: [birth year] Today's Date: 09/08/2016    History of Present Illness Pt is a 66 yo male admitted on 09/06/16 for left TKA. PMH significant for COPD, Dysrhythmia, GERD, HTN, Sleep Apnea.     PT Comments    Pt is POD 2 and moving much better with therapy. Pain is still rated at 6/10, but this is improved from 10/10 yesterday. Pt is able to participate in gait and initiation of exercises to improve strength. Received additional information. Pt lives with his wife in a single level home with 5-6 steps leading into the home with railings. Pt's wife will be available at discharge to assist. Pt will benefit from increasing gait distance and exercises next session.    Follow Up Recommendations  Home health PT;Supervision for mobility/OOB     Equipment Recommendations  Rolling walker with 5" wheels;3in1 (PT)    Recommendations for Other Services       Precautions / Restrictions Precautions Precautions: Knee Precaution Booklet Issued: Yes (comment) Precaution Comments: reviewed handout and no pillow under knee Required Braces or Orthoses: Knee Immobilizer - Left Knee Immobilizer - Left: Other (comment) (when in bed sleeping) Restrictions Weight Bearing Restrictions: Yes LLE Weight Bearing: Weight bearing as tolerated    Mobility  Bed Mobility Overal bed mobility: Needs Assistance Bed Mobility: Supine to Sit     Supine to sit: Min assist     General bed mobility comments: Min A to bring LLE EOB  Transfers Overall transfer level: Needs assistance Equipment used: Rolling walker (2 wheeled) Transfers: Sit to/from Stand Sit to Stand: Min assist         General transfer comment: Min A from EOB for safety. Improved ability to stand without assistance  Ambulation/Gait Ambulation/Gait assistance: Min assist Ambulation Distance (Feet): 60 Feet Assistive device: Rolling walker (2 wheeled) Gait  Pattern/deviations: Step-to pattern;Decreased step length - right;Decreased stance time - left;Antalgic;Trunk flexed Gait velocity: decreased Gait velocity interpretation: Below normal speed for age/gender General Gait Details: Min A For safety and to maneuver equipment. Improved weight bearing through LLE and improved tolerance for gait distance.    Stairs            Wheelchair Mobility    Modified Rankin (Stroke Patients Only)       Balance Overall balance assessment: Needs assistance Sitting-balance support: No upper extremity supported;Feet supported Sitting balance-Leahy Scale: Good Sitting balance - Comments: sitting EOB    Standing balance support: Bilateral upper extremity supported Standing balance-Leahy Scale: Poor Standing balance comment: relies on Rw for stability                    Cognition Arousal/Alertness: Awake/alert Behavior During Therapy: WFL for tasks assessed/performed Overall Cognitive Status: Within Functional Limits for tasks assessed                      Exercises Total Joint Exercises Ankle Circles/Pumps: AROM;Both;20 reps;Supine Quad Sets: AROM;Left;10 reps;Supine Heel Slides: AAROM;Left;10 reps;Supine Goniometric ROM: 5-75    General Comments        Pertinent Vitals/Pain Pain Assessment: 0-10 Pain Score: 6  Pain Location: left knee Pain Descriptors / Indicators: Sharp;Sore Pain Intervention(s): Monitored during session;Premedicated before session;Ice applied;Repositioned    Home Living                      Prior Function            PT  Goals (current goals can now be found in the care plan section) Acute Rehab PT Goals Patient Stated Goal: to have less pain Progress towards PT goals: Progressing toward goals    Frequency    7X/week      PT Plan Current plan remains appropriate    Co-evaluation             End of Session Equipment Utilized During Treatment: Gait belt Activity  Tolerance: Patient tolerated treatment well Patient left: in chair;with call bell/phone within reach     Time: [id number] PT Time Calculation (min) (ACUTE ONLY): 30 min  Charges:  $Gait Training: 8-22 mins $Therapeutic Exercise: 8-22 mins                    G Codes:      Colin BroachSabra M. Damonique Brunelle PT, DPT  [phone]  09/08/2016, 10:24 AM

## 2016-09-08 NOTE — Significant Event (Signed)
Rapid Response Event Note  Overview:  Called by RN to evaluate patient for increased HR Time Called: 1059 Arrival Time: 1105 Event Type: Cardiac, Hypotension, Neurologic  Initial Focused Assessment: Called by RN to evaluate patient for increased HR and low BP.  On my arrival to patients room, RN and family at bedside, patient sitting in chair on nasal cannula.  As per RN, patient was working with OT and was finishing up session when HR was noted to be 220's.  Patient is lethargic, arouses easily, just received pain medicine recently, denies CP, SOB or dizziness.  AS per RN patient sats have been dropping now requiring oxygen.  SBP is low at 80's, HR currently 115, RR 16.  Nasal cannula removed while patient sleeping sats dropped to 87% on Ra, placed back on oxygen, sats 94%.  Skin warm and dry.  Patient endorses feels sleepy, as well as pain still 6/10.  As per RN pain control has been an issue  Interventions:  EKG being done.  Patient appears to have an almost paradoxial breathing pattern c/w sleep apnea.  PA notified orders given .   Plan of Care (if not transferred):  RN to keep pulse ox and monitor patient  Event Summary:  RN to call if assistance needed   at      at          [facility]fe, Maryagnes Amosenise Ann

## 2016-09-08 NOTE — Progress Notes (Signed)
Physical Therapy Treatment Patient Details Name: Ethan Harper MRN: [id number] DOB: 07-27-1951 Today's Date: 09/08/2016    History of Present Illness Pt is a 66 yo male admitted on 09/06/16 for left TKA. PMH significant for COPD, Dysrhythmia, GERD, HTN, Sleep Apnea.     PT Comments    Pt is moving well this PM session with family present. Pt performed gait x 100' this session with improved sequencing. Pt's pain is better controlled. HR and O2 checked at 50' and pt HR is 117 and O2 94%. Pt will require further instruction on exercises and stair negotiation prior to discharge.    Follow Up Recommendations  Home health PT;Supervision for mobility/OOB     Equipment Recommendations  Rolling walker with 5" wheels;3in1 (PT)    Recommendations for Other Services       Precautions / Restrictions Precautions Precautions: Knee Precaution Booklet Issued: Yes (comment) Precaution Comments: Reviewed no pillow under knee and knee precautions during ADL. Required Braces or Orthoses: Knee Immobilizer - Left Knee Immobilizer - Left: Other (comment) (when in bed) Restrictions Weight Bearing Restrictions: Yes LLE Weight Bearing: Weight bearing as tolerated    Mobility  Bed Mobility Overal bed mobility: Needs Assistance Bed Mobility: Sit to Supine       Sit to supine: Min guard   General bed mobility comments: Min guard for safety  Transfers Overall transfer level: Needs assistance Equipment used: Rolling walker (2 wheeled) Transfers: Sit to/from Stand Sit to Stand: Min guard         General transfer comment: Min guard for safety from recliner  Ambulation/Gait Ambulation/Gait assistance: Min guard Ambulation Distance (Feet): 100 Feet Assistive device: Rolling walker (2 wheeled) Gait Pattern/deviations: Step-to pattern;Decreased step length - right;Decreased stance time - left;Antalgic Gait velocity: decreased Gait velocity interpretation: Below normal speed for  age/gender General Gait Details: Min antalgic gait, min guard for gait this session with improved sequencing and mobility   Stairs            Wheelchair Mobility    Modified Rankin (Stroke Patients Only)       Balance Overall balance assessment: Needs assistance Sitting-balance support: No upper extremity supported;Feet supported Sitting balance-Leahy Scale: Good Sitting balance - Comments: sitting EOB    Standing balance support: Single extremity supported Standing balance-Leahy Scale: Fair Standing balance comment: Able to stand at [facility] for therapist to adjust RW                    Cognition Arousal/Alertness: Awake/alert Behavior During Therapy: WFL for tasks assessed/performed Overall Cognitive Status: Within Functional Limits for tasks assessed                 General Comments: Pain improving    Exercises      General Comments        Pertinent Vitals/Pain Pain Assessment: Faces Faces Pain Scale: Hurts even more Pain Location: left knee Pain Descriptors / Indicators: Sharp;Sore Pain Intervention(s): Monitored during session;Premedicated before session;Ice applied    Home Living                      Prior Function            PT Goals (current goals can now be found in the care plan section) Acute Rehab PT Goals Patient Stated Goal: to have less pain Progress towards PT goals: Progressing toward goals    Frequency    7X/week      PT Plan Current plan remains  appropriate    Co-evaluation             End of Session Equipment Utilized During Treatment: Gait belt Activity Tolerance: Patient tolerated treatment well Patient left: in bed;in CPM;with call bell/phone within reach;with family/visitor present     Time: [id number] PT Time Calculation (min) (ACUTE ONLY): 20 min  Charges:  $Gait Training: 8-22 mins                    G Codes:      Colin BroachSabra M. Andrell Bergeson PT, DPT  [phone]  09/08/2016, 4:20 PM

## 2016-09-08 NOTE — Evaluation (Signed)
Occupational Therapy Evaluation Patient Details Name: Ethan ByarsColumbus Harper MRN: [id number] DOB: 1950-12-16 Today's Date: 09/08/2016    History of Present Illness Pt is a 66 yo male admitted on 09/06/16 for left TKA. PMH significant for COPD, Dysrhythmia, GERD, HTN, Sleep Apnea.    Clinical Impression   PTA, pt was independent with ADL and functional mobility. He currently requires min guard to min assist with functional toilet transfers in order to maintain balance and safety as well as min guard assist for LB ADL. Pt will have 24 hour assistance for the first few days post-acute D/C and intermittent following this. No OT follow-up recommended post-acute D/C but OT will continue to follow acutely with focus on shower transfers and energy conservation.  Of note, pt with SpO2 ranging from 88-92% on RA on OT arrival. With encouragement for breathing technique, this improved to 95% during seated ADL with HR hovering around 100 bpm. Pt assisted to bathroom for standing ADL and to use commode and pt tolerated well with no signs of distress. On return to chair pt with HR spiking into the 220's and O2 saturation at 88-92% on RA. 2LO2 reapplied.  Informed bedside RN who came quickly to assist. HR 119 when measured with dynamap while monitor continued to read >200 bpm and BP 82/65 and pt becoming lethargic and sleepy but easily aroused. RN and nurse tech as well as Rapid Response RN monitoring and attending to pt.    Follow Up Recommendations  Supervision/Assistance - 24 hour;No OT follow up    Equipment Recommendations  None recommended by OT    Recommendations for Other Services       Precautions / Restrictions Precautions Precautions: Knee Precaution Booklet Issued: Yes (comment) Precaution Comments: Reviewed no pillow under knee and knee precautions during ADL. Required Braces or Orthoses: Knee Immobilizer - Left Knee Immobilizer - Left: Other (comment) (when in bed sleeping to maintain good  alignment) Restrictions Weight Bearing Restrictions: Yes LLE Weight Bearing: Weight bearing as tolerated      Mobility Bed Mobility Overal bed mobility: Needs Assistance Bed Mobility: Supine to Sit     Supine to sit: Min assist     General bed mobility comments: OOB in recliner on OT arrival.  Transfers Overall transfer level: Needs assistance Equipment used: Rolling walker (2 wheeled) Transfers: Sit to/from Stand Sit to Stand: Min assist         General transfer comment: Min A from EOB for safety. Improved ability to stand without assistance    Balance Overall balance assessment: Needs assistance Sitting-balance support: No upper extremity supported;Feet supported Sitting balance-Leahy Scale: Good Sitting balance - Comments: sitting EOB    Standing balance support: Bilateral upper extremity supported Standing balance-Leahy Scale: Poor Standing balance comment: LOB x1 at sink requiring therapist assist to correct; recommended pt maintain at least single UE support. relies on Rw for stability                            ADL Overall ADL's : Needs assistance/impaired     Grooming: Set up;Sitting   Upper Body Bathing: Supervision/ safety;Sitting   Lower Body Bathing: Min guard;Sit to/from stand   Upper Body Dressing : Supervision/safety;Sitting   Lower Body Dressing: Min guard;Sit to/from stand   Toilet Transfer: Min guard;Ambulation;RW;Comfort height toilet;Minimal assistance   Toileting- Clothing Manipulation and Hygiene: Min guard;Sit to/from stand       Functional mobility during ADLs: Min guard;Minimal assistance;Rolling walker General ADL  Comments: Pt educated on knee precautions during ADL including no pillow under knee as well as safe use of DME.      Vision Vision Assessment?: No apparent visual deficits   Perception     Praxis      Pertinent Vitals/Pain Pain Assessment: 0-10 Pain Score: 7  Pain Location: left knee Pain  Descriptors / Indicators: Sharp;Sore Pain Intervention(s): Monitored during session;Repositioned;Ice applied;Limited activity within patient's tolerance     Hand Dominance Right   Extremity/Trunk Assessment Upper Extremity Assessment Upper Extremity Assessment: Overall WFL for tasks assessed   Lower Extremity Assessment Lower Extremity Assessment: Defer to PT evaluation       Communication Communication Communication: No difficulties   Cognition Arousal/Alertness: Awake/alert Behavior During Therapy: WFL for tasks assessed/performed Overall Cognitive Status: Within Functional Limits for tasks assessed                 General Comments: Pain improving   General Comments       Exercises Exercises: Total Joint     Shoulder Instructions      Home Living Family/patient expects to be discharged to:: Private residence Living Arrangements: Spouse/significant other Available Help at Discharge: Family;Available 24 hours/day (24/7 initially, intermittently after that) Type of Home: House Home Access: Stairs to enter [employer] of Steps: 3   Home Layout: One level     Bathroom Shower/Tub: Producer, television/film/video: Handicapped height     Home Equipment: Bedside commode;Shower seat - built in          Prior Functioning/Environment Level of Independence: Independent        Comments: Likes to mow the grass        OT Problem List: Decreased strength;Decreased range of motion;Decreased activity tolerance;Impaired balance (sitting and/or standing);Cardiopulmonary status limiting activity;Decreased knowledge of use of DME or AE;Decreased knowledge of precautions;Decreased safety awareness;Pain   OT Treatment/Interventions: Self-care/ADL training;Therapeutic exercise;Energy conservation;DME and/or AE instruction;Therapeutic activities;Patient/family education;Balance training    OT Goals(Current goals can be found in the care plan section) Acute  Rehab OT Goals Patient Stated Goal: to have less pain OT Goal Formulation: With patient/family Time For Goal Achievement: 09/22/16 Potential to Achieve Goals: Good ADL Goals Pt Will Perform Lower Body Bathing: with modified independence;sit to/from stand Pt Will Perform Lower Body Dressing: with modified independence;sit to/from stand Pt Will Transfer to Toilet: with modified independence;ambulating;bedside commode Pt Will Perform Toileting - Clothing Manipulation and hygiene: with modified independence;sit to/from stand Pt Will Perform Tub/Shower Transfer: Shower transfer;with modified independence;shower seat;rolling walker;ambulating Additional ADL Goal #1: Pt will incorporate 3 energy conservation strategies into morning routine independently as a precursor to ADL independence.  OT Frequency: Min 2X/week   Barriers to D/C:            Co-evaluation              End of Session Equipment Utilized During Treatment: Gait belt;Rolling walker;Oxygen (2LO2 returned after session due to medical complications) Nurse Communication: Other (comment) (Pt with extreme tachycardia after exercise)  Activity Tolerance: Treatment limited secondary to medical complications (Comment) (Pt tachycardic with low BP and O2 desat to 88%) Patient left: in chair;with call bell/phone within reach;with nursing/sitter in room;Other (comment) (With rapid response providing care)   Time: 1610-9604 OT Time Calculation (min): 32 min Charges:  OT General Charges $OT Visit: 1 Procedure OT Evaluation $OT Eval Moderate Complexity: 1 Procedure OT Treatments $Self Care/Home Management : 8-22 mins  Doristine Section, OTR/L [phone] 09/08/2016, 11:30 AM

## 2016-09-09 ENCOUNTER — Encounter (HOSPITAL_COMMUNITY): Payer: Self-pay | Admitting: Orthopedic Surgery

## 2016-09-09 LAB — PROTIME-INR
INR: 1.92
Prothrombin Time: 22.2 seconds — ABNORMAL HIGH (ref 11.4–15.2)

## 2016-09-09 LAB — CBC
HEMATOCRIT: 39.8 % (ref 39.0–52.0)
Hemoglobin: 13.4 g/dL (ref 13.0–17.0)
MCH: 29.7 pg (ref 26.0–34.0)
MCHC: 33.7 g/dL (ref 30.0–36.0)
MCV: 88.2 fL (ref 78.0–100.0)
Platelets: 170 10*3/uL (ref 150–400)
RBC: 4.51 MIL/uL (ref 4.22–5.81)
RDW: 13.5 % (ref 11.5–15.5)
WBC: 6.1 10*3/uL (ref 4.0–10.5)

## 2016-09-09 MED ORDER — HYDROMORPHONE HCL 2 MG PO TABS: 2.0000 mg | ORAL_TABLET | Freq: Four times a day (QID) | ORAL | 0 refills | Status: DC | PRN

## 2016-09-09 NOTE — Progress Notes (Addendum)
Occupational Therapy Treatment and Discharge Patient Details Name: Ethan Harper MRN: [id number] DOB: 04-17-1951 Today's Date: 09/09/2016    History of present illness Pt is a 66 yo male admitted on 09/06/16 for left TKA. PMH significant for COPD, Dysrhythmia, GERD, HTN, Sleep Apnea.    OT comments  This 66 yo male admitted and underwent above presents to acute OT with all education completed, we will D/C from acute OT. On arrival pt on 2 liters of O2 with sats 97 and HR 105, O2 removed and on RA at after ambulating to bathroom sats 100 and HR 97. End of session O2 sats on RA 97 and HR 105 (same as when we started except pt on RA instead of 2 liters)  Follow Up Recommendations  Supervision/Assistance - 24 hour;No OT follow up    Equipment Recommendations  None recommended by OT       Precautions / Restrictions Precautions Precautions: Knee Precaution Comments: monitor HR/sats Restrictions Weight Bearing Restrictions: Yes LLE Weight Bearing: Weight bearing as tolerated       Mobility Bed Mobility Overal bed mobility: Modified Independent Bed Mobility: Supine to Sit     Supine to sit: Modified independent (Device/Increase time)     General bed mobility comments: increased time, HOB flat and no rail  Transfers Overall transfer level: Needs assistance Equipment used: Rolling walker (2 wheeled) Transfers: Sit to/from Stand Sit to Stand: Min guard              Balance Overall balance assessment: Needs assistance Sitting-balance support: No upper extremity supported;Feet supported Sitting balance-Leahy Scale: Good Sitting balance - Comments: sitting EOB    Standing balance support: Single extremity supported Standing balance-Leahy Scale: Fair Standing balance comment: able to stand and have one hand off of RW at a time to adjust underwear at toilet                   ADL Overall ADL's : Needs assistance/impaired                         Toilet  Transfer: Min guard;Ambulation;RW;Comfort height toilet;Grab bars   Toileting- Clothing Manipulation and Hygiene: Min guard;Sit to/from stand   Tub/ Shower Transfer: Min guard;Ambulation;Rolling walker;Walk-in shower Tub/Shower Transfer Details (indicate cue type and reason): simulated to a chair after stepping over a blanket rolled up as a threshold   General ADL Comments: Pt educated on the most efficent sequence of getting dressed. Pt also given handout on fall prevention and energy conservation                Cognition   Behavior During Therapy: WFL for tasks assessed/performed Overall Cognitive Status: Within Functional Limits for tasks assessed                                    Pertinent Vitals/ Pain       Pain Assessment: 0-10 Pain Score: 4  Pain Location: left knee Pain Descriptors / Indicators: Aching;Sore;Other (Comment) (occassional sharp) Pain Intervention(s): Monitored during session;Premedicated before session;Limited activity within patient's tolerance         Frequency  Min 2X/week        Progress Toward Goals  OT Goals(current goals can now be found in the care plan section)  Progress towards OT goals:  (all acute OT education completed)     Plan Discharge plan remains appropriate  End of Session Equipment Utilized During Treatment: Gait belt;Rolling walker CPM Left Knee CPM Left Knee: Off   Activity Tolerance Patient tolerated treatment well   Patient Left in chair;with call bell/phone within reach   Nurse Communication  (Pt sats/HR at rest on RA after I finished 97 and 105 respectively--she OK'd to leave O2 off)        Time: [id number] OT Time Calculation (min): 30 min  Charges: OT General Charges $OT Visit: 1 Procedure OT Treatments $Self Care/Home Management : 23-37 mins  Evette GeorgesLeonard, Emrie Gayle Eva [phone] 09/09/2016, 10:44 AM

## 2016-09-09 NOTE — Progress Notes (Signed)
Physical Therapy Treatment Patient Details Name: Ethan Harper MRN: [id number] DOB: 1950-11-11 Today's Date: 09/09/2016    History of Present Illness Pt is a 66 yo male admitted on 09/06/16 for left TKA. PMH significant for COPD, Dysrhythmia, GERD, HTN, Sleep Apnea.     PT Comments    Patient continues to progress toward mobility goals. Stair training completed this session. Current plan remains appropriate.   Follow Up Recommendations  Home health PT;Supervision for mobility/OOB     Equipment Recommendations  Rolling walker with 5" wheels;3in1 (PT)    Recommendations for Other Services       Precautions / Restrictions Precautions Precautions: Knee Precaution Comments: reviewed precautions/positioning Required Braces or Orthoses: Knee Immobilizer - Left Knee Immobilizer - Left: Other (comment) (when in bed) Restrictions Weight Bearing Restrictions: Yes LLE Weight Bearing: Weight bearing as tolerated    Mobility  Bed Mobility Overal bed mobility: Needs Assistance Bed Mobility: Sit to Supine     Supine to sit: Modified independent (Device/Increase time) Sit to supine: Supervision   General bed mobility comments: supervision for safety; increased time and effort  Transfers Overall transfer level: Needs assistance Equipment used: Rolling walker (2 wheeled) Transfers: Sit to/from Stand Sit to Stand: Supervision         General transfer comment: supervision for safety; cues for hand placement  Ambulation/Gait Ambulation/Gait assistance: Supervision Ambulation Distance (Feet): 150 Feet Assistive device: Rolling walker (2 wheeled) Gait Pattern/deviations: Step-through pattern;Decreased stance time - left;Decreased step length - right;Decreased weight shift to left;Antalgic Gait velocity: decreased   General Gait Details: cues for sequencing and safe proximity of RW; pt with improved cadence and consistency with RW use with increased distance   Stairs Stairs:  Yes   Stair Management: One rail Left;Sideways;No rails;Forwards;With crutches Number of Stairs: 4 General stair comments: cues for sequencing and technique; 2 steps sideways with L hand rail and min A and 2 steps with bilat crutches and min guard for safety  Wheelchair Mobility    Modified Rankin (Stroke Patients Only)       Balance Overall balance assessment: Needs assistance Sitting-balance support: No upper extremity supported;Feet supported Sitting balance-Leahy Scale: Good Sitting balance - Comments: sitting EOB    Standing balance support: Single extremity supported Standing balance-Leahy Scale: Fair Standing balance comment: [facility]                    Cognition Arousal/Alertness: Awake/alert Behavior During Therapy: WFL for tasks assessed/performed Overall Cognitive Status: Within Functional Limits for tasks assessed                      Exercises Total Joint Exercises Quad Sets: AROM;Left;10 reps Heel Slides: AROM;Left;10 reps Long Arc Quad: AROM;Left;10 reps Goniometric ROM: 5-85    General Comments        Pertinent Vitals/Pain Pain Assessment: Faces Pain Score: 4  Faces Pain Scale: Hurts little more Pain Location: left knee Pain Descriptors / Indicators: Sore;Guarding Pain Intervention(s): Limited activity within patient's tolerance;Monitored during session;Premedicated before session;Repositioned;Ice applied    Home Living                      Prior Function            PT Goals (current goals can now be found in the care plan section) Acute Rehab PT Goals Patient Stated Goal: go home Progress towards PT goals: Progressing toward goals  Frequency    7X/week      PT Plan Current plan remains appropriate    Co-evaluation             End of Session Equipment Utilized During Treatment: Gait belt Activity Tolerance: Patient tolerated treatment well Patient left: in  bed;with call bell/phone within reach (L LE in zero degree foam)     Time: 4098-1191 PT Time Calculation (min) (ACUTE ONLY): 42 min  Charges:  $Gait Training: 8-22 mins $Therapeutic Exercise: 8-22 mins $Therapeutic Activity: 8-22 mins                    G Codes:      Derek Mound, PTA Pager: [phone]   09/09/2016, 1:07 PM

## 2016-09-09 NOTE — Op Note (Signed)
NAMESAILOR, HAUGHN              ACCOUNT NO.:  [id number]  MEDICAL RECORD NO.:  [id number]  LOCATION:                                 FACILITY:  PHYSICIAN:  Almedia Balls. Ranell Patrick, M.D. DATE OF BIRTH:  [birth year]  DATE OF PROCEDURE:  09/06/2016 DATE OF DISCHARGE:                              OPERATIVE REPORT   PREOPERATIVE DIAGNOSIS:  Left knee end-stage arthritis.  POSTOPERATIVE DIAGNOSIS:  Left knee end-stage arthritis, primary osteoarthritis.  PROCEDURE PERFORMED:  Left total knee arthroplasty using DePuy Sigma rotating platform prosthesis.  ATTENDING SURGEON:  Almedia Balls. Ranell Patrick, MD.  ASSISTANT:  Donnie Coffin. Dixon, PAC, who scrubbed the entire procedure and necessary for satisfactory completion of surgery.  ANESTHESIA:  Spinal anesthesia was used plus femoral block.  ESTIMATED BLOOD LOSS:  Under 100 mL.  FLUID REPLACEMENT:  1500 mL crystalloid.  INSTRUMENT COUNTS:  Correct.  COMPLICATIONS:  There were no complications.  Perioperative antibiotics were given.  INDICATIONS:  The patient is a 66 year old male with a history of worsening left knee pain secondary to end-stage arthritis.  The patient has bone-on-bone changes on x-ray.  The patient has had progressive pain to the point where he has rest pain and night pain.  The patient ambulates with the use of an assistive device.  The patient presents now for total knee arthroplasty to restore function and relieve pain. Informed consent was obtained.  DESCRIPTION OF PROCEDURE:  After an adequate level of anesthesia was achieved, the patient was positioned in supine on the operating room table.  Left leg correctly identified and a nonsterile tourniquet placed on the left proximal thigh.  Left leg was sterilely prepped and draped in usual manner.  Time-out called.  We then went ahead and elevated the leg and exsanguinated the limb using an Esmarch bandage.  We placed tourniquet 325 mmHg placing the knee in flexion.  A  longitudinal midline incision created with a #10 blade scalpel, dissecting down through subcutaneous tissues, identified the parapatellar tissues and performed a medial parapatellar arthrotomy using a fresh 10 blade.  We everted the patella, divided the lateral patellofemoral ligaments.  We then entered the distal femur with a step-cut drill.  We placed intramedullary resection guide and resected 10 mm off, set on 5 degrees left.  Next, we went ahead and sized the femur to a size 4, anterior down.  We performed our anterior, posterior, and chamfer cuts with the 4-in-1 block.  We then removed ACL, PCL, and meniscal tissue; subluxed the tibia anteriorly and performed our tibial cut, 2 mm off the affected medial side with the external tibial cutting guide, we used perpendicular to the long axis of the tibia with minimal posterior slope for the posterior cruciate substituting prosthesis.  We then checked our gaps, which were symmetric at 10 to 12.5 mm.  We then went ahead and removed excess posterior osteophytes using a laminar spreader and an osteotome. At this point, we went ahead and completed our tibial preparation with a modular drill and keel punch.  Next, we went ahead and did the box cut on the femur for the 4 narrow prosthesis.  We then placed our trial components in the tibia, it  was a size 3 in the tibia, and a 4 narrow on the femur, and then we resurfaced the patella going from the 25-mm thickness down to 16 mm thickness for the 38 mm patellar button.  We drilled our lug holes and then placed our button in place.  We ranged the knee and had nice tracking with no-touch technique.  We irrigated the knee thoroughly after removal of trial components.  I dried the bone and then cemented the components into place with DePuy high-viscosity cement vacuum mixed.  We placed the knee in extension with a 12.5 poly insert to allow the cement to harden.  Once it was hardened, we removed excess  cement with 0.25-inch curved osteotome.  We then trialed the knee.  We were happy with the 12.5.  We removed the trial 12.5 and selected the real __________ 12.5 poly and placed that on the tibia once we had inspected for any other debris or cement.  We then reduced the knee with a nice pop medially.  We were able to get full extension, excellent flexion stability.  We irrigated again thoroughly.  We then went ahead and closed the parapatellar arthrotomy with #1 Vicryl suture using TXA inside the knee joint, topically as we done some IV __________ at the beginning of the surgery.  With the topical TXA __________ closed the knee with a #1 Vicryl.  Once we had the knee closed, we then did irrigation again and then closed the subcu tissue with 0 and 2-0 layered subcutaneous closure and then 4-0 Monocryl for skin.  Steri-Strips applied followed by a sterile dressing.  The patient tolerated the surgery well.     Almedia BallsSteven R. Ranell PatrickNorris, M.D.   ______________________________ Almedia BallsSteven R. Ranell PatrickNorris, M.D.    SRN/MEDQ  D:  09/06/2016  T:  09/07/2016  Job:  [id number]

## 2016-09-09 NOTE — Progress Notes (Signed)
Orthopedics Progress Note  Subjective: Pain better controlled, ok for D/C home  Objective:  Vitals:   09/08/16 1300 09/08/16 2109  BP: 105/75 121/75  Pulse: (!) 108 84  Resp: 16 16  Temp: 98.5 F (36.9 C) 97.5 F (36.4 C)    General: Awake and alert  Musculoskeletal: left knee incision CDI, aquacel in place Neurovascularly intact  Lab Results  Component Value Date   WBC 6.6 09/08/2016   HGB 14.2 09/08/2016   HCT 42.5 09/08/2016   MCV 88.4 09/08/2016   PLT 182 09/08/2016       Component Value Date/Time   NA 132 (L) 09/07/2016 0537   K 3.3 (L) 09/07/2016 0537   CL 97 (L) 09/07/2016 0537   CO2 24 09/07/2016 0537   GLUCOSE 205 (H) 09/07/2016 0537   BUN 6 09/07/2016 0537   CREATININE 0.94 09/07/2016 0537   CALCIUM 8.3 (L) 09/07/2016 0537   GFRNONAA >60 09/07/2016 0537   GFRAA >60 09/07/2016 0537    Lab Results  Component Value Date   INR 1.14 09/08/2016   INR 1.05 09/07/2016   INR 1.00 06/06/2015    Assessment/Plan: POD #3 s/p Procedure(s): TOTAL KNEE ARTHROPLASTY D/C to home on coumadin with Lovenox bridge Home health with home CPM Follow up in two weeks in the office  Viviann SpareSteven R. Ranell PatrickNorris, MD 09/09/2016 7:05 AM

## 2016-09-11 NOTE — Discharge Summary (Signed)
Physician Discharge Summary   Patient ID: Ethan ByarsColumbus Cottone MRN: [id number] DOB/AGE: [birth year] 67 y.o.  Admit date: 09/06/2016 Discharge date: 09/11/2016  Admission Diagnoses:  Active Problems:   H/O total knee replacement, left   Discharge Diagnoses:  Same   Surgeries: Procedure(s): TOTAL KNEE ARTHROPLASTY on 09/06/2016   Consultants: PT, OT, D/C planning  Discharged Condition: Stable  Hospital Course: Ethan Harper is an 66 y.o. male who was admitted 09/06/2016 with a chief complaint of left knee pain, and found to have a diagnosis of left knee severe primary OA.  They were brought to the operating room on 09/06/2016 and underwent the above named procedures.    The patient had an uncomplicated hospital course and was stable for discharge.  Recent vital signs:  Vitals:   09/08/16 1300 09/08/16 2109  BP: 105/75 121/75  Pulse: (!) 108 84  Resp: 16 16  Temp: 98.5 F (36.9 C) 97.5 F (36.4 C)    Recent laboratory studies:  Results for orders placed or performed during the hospital encounter of 09/06/16  CBC  Result Value Ref Range   WBC 6.7 4.0 - 10.5 K/uL   RBC 4.83 4.22 - 5.81 MIL/uL   Hemoglobin 14.3 13.0 - 17.0 g/dL   HCT 40.942.9 81.139.0 - 91.452.0 %   MCV 88.8 78.0 - 100.0 fL   MCH 29.6 26.0 - 34.0 pg   MCHC 33.3 30.0 - 36.0 g/dL   RDW 78.213.6 95.611.5 - 21.315.5 %   Platelets 176 150 - 400 K/uL  Creatinine, serum  Result Value Ref Range   Creatinine, Ser 1.07 0.61 - 1.24 mg/dL   GFR calc non Af Amer >60 >60 mL/min   GFR calc Af Amer >60 >60 mL/min  Protime-INR  Result Value Ref Range   Prothrombin Time 13.7 11.4 - 15.2 seconds   INR 1.05   CBC  Result Value Ref Range   WBC 7.3 4.0 - 10.5 K/uL   RBC 4.90 4.22 - 5.81 MIL/uL   Hemoglobin 14.5 13.0 - 17.0 g/dL   HCT 08.643.5 57.839.0 - 46.952.0 %   MCV 88.8 78.0 - 100.0 fL   MCH 29.6 26.0 - 34.0 pg   MCHC 33.3 30.0 - 36.0 g/dL   RDW 62.913.8 52.811.5 - 41.315.5 %   Platelets 179 150 - 400 K/uL  Basic metabolic panel  Result Value Ref Range   Sodium  132 (L) 135 - 145 mmol/L   Potassium 3.3 (L) 3.5 - 5.1 mmol/L   Chloride 97 (L) 101 - 111 mmol/L   CO2 24 22 - 32 mmol/L   Glucose, Bld 205 (H) 65 - 99 mg/dL   BUN 6 6 - 20 mg/dL   Creatinine, Ser 2.440.94 0.61 - 1.24 mg/dL   Calcium 8.3 (L) 8.9 - 10.3 mg/dL   GFR calc non Af Amer >60 >60 mL/min   GFR calc Af Amer >60 >60 mL/min   Anion gap 11 5 - 15  Protime-INR  Result Value Ref Range   Prothrombin Time 14.6 11.4 - 15.2 seconds   INR 1.14   CBC  Result Value Ref Range   WBC 6.6 4.0 - 10.5 K/uL   RBC 4.81 4.22 - 5.81 MIL/uL   Hemoglobin 14.2 13.0 - 17.0 g/dL   HCT 01.042.5 27.239.0 - 53.652.0 %   MCV 88.4 78.0 - 100.0 fL   MCH 29.5 26.0 - 34.0 pg   MCHC 33.4 30.0 - 36.0 g/dL   RDW 64.413.8 03.411.5 - 74.215.5 %   Platelets 182 150 -  400 K/uL  Protime-INR  Result Value Ref Range   Prothrombin Time 22.2 (H) 11.4 - 15.2 seconds   INR 1.92   CBC  Result Value Ref Range   WBC 6.1 4.0 - 10.5 K/uL   RBC 4.51 4.22 - 5.81 MIL/uL   Hemoglobin 13.4 13.0 - 17.0 g/dL   HCT 16.1 09.6 - 04.5 %   MCV 88.2 78.0 - 100.0 fL   MCH 29.7 26.0 - 34.0 pg   MCHC 33.7 30.0 - 36.0 g/dL   RDW 40.9 81.1 - 91.4 %   Platelets 170 150 - 400 K/uL    Discharge Medications:   Allergies as of 09/09/2016   No Known Allergies     Medication List    STOP taking these medications   diazepam 5 MG tablet Commonly known as:  VALIUM   famotidine 20 MG tablet Commonly known as:  PEPCID     TAKE these medications   albuterol (2.5 MG/3ML) 0.083% nebulizer solution Commonly known as:  PROVENTIL Take 3 mLs (2.5 mg total) by nebulization every 4 (four) hours as needed for wheezing.   amitriptyline 50 MG tablet Commonly known as:  ELAVIL Take 50 mg by mouth at bedtime.   amLODipine 10 MG tablet Commonly known as:  NORVASC Take 10 mg by mouth daily.   atorvastatin 10 MG tablet Commonly known as:  LIPITOR Take 10 mg by mouth at bedtime.   azelastine 0.1 % nasal spray Commonly known as:  ASTELIN Place 2 sprays into the  nose daily as needed for allergies. Use in each nostril as directed   budesonide-formoterol 160-4.5 MCG/ACT inhaler Commonly known as:  SYMBICORT Take 2 puffs first thing in am and then another 2 puffs about 12 hours later. What changed:  how much to take  how to take this  when to take this  reasons to take this  additional instructions   enoxaparin 40 MG/0.4ML injection Commonly known as:  LOVENOX Inject 0.4 mLs (40 mg total) into the skin daily. 30 days post op   esomeprazole 40 MG capsule Commonly known as:  NEXIUM Take 40 mg by mouth daily before breakfast.   furosemide 20 MG tablet Commonly known as:  LASIX Take 20 mg by mouth daily as needed for fluid or edema.   HYDROcodone-acetaminophen 10-325 MG tablet Commonly known as:  NORCO Take 1-2 tablets by mouth every 4 (four) hours as needed for moderate pain. What changed:  additional instructions   HYDROmorphone 2 MG tablet Commonly known as:  DILAUDID Take 1 tablet (2 mg total) by mouth every 4 (four) hours as needed for severe pain (do not use along with norco). What changed:  Another medication with the same name was added. Make sure you understand how and when to take each.   HYDROmorphone 2 MG tablet Commonly known as:  DILAUDID Take 1 tablet (2 mg total) by mouth every 6 (six) hours as needed for severe pain (breakthrough only). What changed:  You were already taking a medication with the same name, and this prescription was added. Make sure you understand how and when to take each.   losartan-hydrochlorothiazide 100-12.5 MG tablet Commonly known as:  HYZAAR Take 1 tablet by mouth daily.   lubiprostone 8 MCG capsule Commonly known as:  AMITIZA Take 8 mcg by mouth daily with breakfast.   meloxicam 15 MG tablet Commonly known as:  MOBIC Take 15 mg by mouth daily. Will stop prior to procedure   methocarbamol 500 MG tablet Commonly  known as:  ROBAXIN Take 1 tablet (500 mg total) by mouth 3 (three) times  daily as needed.   montelukast 10 MG tablet Commonly known as:  SINGULAIR Take 10 mg by mouth at bedtime.   MOVANTIK 25 MG Tabs tablet Generic drug:  naloxegol oxalate Take 25 mg by mouth daily.   oxyCODONE-acetaminophen 5-325 MG tablet Commonly known as:  ROXICET Take 1-2 tablets by mouth every 4 (four) hours as needed for severe pain.   triamcinolone 55 MCG/ACT nasal inhaler Commonly known as:  NASACORT Place 2 sprays into the nose daily as needed (allergies).   warfarin 5 MG tablet Commonly known as:  COUMADIN Take 1 tablet (5 mg total) by mouth daily. Take as directed per the pharmacist for INR target from 2.5-3.0 for 30 days post op   ZOLMitriptan 2.5 MG tablet Commonly known as:  ZOMIG Take 5 mg by mouth as needed. For migraine.  May repeat once after 2 hours.  Max 10mg /day   zolpidem 10 MG tablet Commonly known as:  AMBIEN Take 10 mg by mouth at bedtime.       Diagnostic Studies: Dg Knee Left Port  Result Date: 09/06/2016 CLINICAL DATA:  66 y/o  M; left knee replacement. EXAM: PORTABLE LEFT KNEE - 1-2 VIEW COMPARISON:  09/06/2016 left knee radiographs. FINDINGS: Postsurgical changes related to total knee replacement with patellar resurfacing and prosthesis. There is air within the joint space and soft tissues as well as edema. There is no hardware related complication or periprosthetic lucency identified. Vascular calcifications. No acute fracture. IMPRESSION: Expected postsurgical changes related to total knee replacement. No acute fracture identified. Electronically Signed   By: Mitzi Hansen M.D.   On: 09/06/2016 22:30   Dg Knee Left Port  Result Date: 09/06/2016 CLINICAL DATA:  Preoperative evaluation for knee replacement. Arthropathy. EXAM: PORTABLE LEFT KNEE - 1-2 VIEW COMPARISON:  None. FINDINGS: Frontal and lateral views were obtained. There is no fracture or dislocation. There is a moderate knee joint effusion. There is marked narrowing medially and in  the patellofemoral joint region. There is spurring in all compartments. 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 There is atherosclerotic calcification in the distal superficial femoral artery and popliteal artery. IMPRESSION: Extensive arthropathy, most marked medially and in the patellofemoral joint. Moderate joint effusion. 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 No fracture or dislocation. There is arterial vascular calcifications/atherosclerosis. Electronically Signed   By: Bretta Bang III M.D.   On: 09/06/2016 14:30    Disposition: 01-Home or Self Care  Discharge Instructions    CPM    Complete by:  As directed    Continuous passive motion machine (CPM):      Use the CPM from 0 to 90 for 6 hours per day.      You may increase by 10 per day.  You should break it up into  3 sessions per day.      Use CPM for 4 weeks or until you are told to stop.   Call MD / Call 911    Complete by:  As directed    If you experience chest pain or shortness of breath, CALL 911 and be transported to the hospital emergency room.  If you develope a fever above 101 F, pus (white drainage) or increased drainage or redness at the wound, or calf pain, call your surgeon's office.   Constipation Prevention    Complete by:  As directed    Drink  plenty of fluids.  Prune juice may be helpful.  You may use a stool softener, such as Colace (over the counter) 100 mg twice a day.  Use MiraLax (over the counter) for constipation as needed.   Diet - low sodium heart healthy    Complete by:  As directed    Do not put a pillow under the knee. Place it under the heel.    Complete by:  As directed    Driving restrictions    Complete by:  As directed    No driving for 4 weeks   Increase activity slowly as tolerated    Complete by:  As directed    TED hose    Complete by:  As directed    Use stockings (TED hose) for 4 weeks on both leg(s).  You may remove  them at night for sleeping.      Follow-up Information    Azlin Zilberman,STEVEN R, MD. Call in 2 weeks.   Specialty:  Orthopedic Surgery Why:  [phone] Contact information: [address] [place] [address] [phone]            Signed: Verlee Rossetti 09/11/2016, 1:56 PM

## 2016-09-25 ENCOUNTER — Encounter (HOSPITAL_COMMUNITY): Payer: Self-pay | Admitting: Emergency Medicine

## 2016-09-25 ENCOUNTER — Emergency Department (HOSPITAL_COMMUNITY): Payer: MEDICARE

## 2016-09-25 ENCOUNTER — Inpatient Hospital Stay (HOSPITAL_COMMUNITY)
Admission: EM | Admit: 2016-09-25 | Discharge: 2016-09-27 | DRG: 192 | Disposition: A | Discharge status: Home / Self Care | Payer: MEDICARE | Attending: Internal Medicine | Admitting: Internal Medicine

## 2016-09-25 DIAGNOSIS — I482 Chronic atrial fibrillation, unspecified: Secondary | ICD-10-CM

## 2016-09-25 DIAGNOSIS — M722 Plantar fascial fibromatosis: Secondary | ICD-10-CM | POA: Diagnosis present

## 2016-09-25 DIAGNOSIS — R0602 Shortness of breath: Secondary | ICD-10-CM | POA: Diagnosis present

## 2016-09-25 DIAGNOSIS — Z66 Do not resuscitate: Secondary | ICD-10-CM | POA: Diagnosis present

## 2016-09-25 DIAGNOSIS — E785 Hyperlipidemia, unspecified: Secondary | ICD-10-CM | POA: Diagnosis present

## 2016-09-25 DIAGNOSIS — Z79899 Other long term (current) drug therapy: Secondary | ICD-10-CM

## 2016-09-25 DIAGNOSIS — G43909 Migraine, unspecified, not intractable, without status migrainosus: Secondary | ICD-10-CM | POA: Diagnosis present

## 2016-09-25 DIAGNOSIS — J441 Chronic obstructive pulmonary disease with (acute) exacerbation: Principal | ICD-10-CM | POA: Diagnosis present

## 2016-09-25 DIAGNOSIS — E559 Vitamin D deficiency, unspecified: Secondary | ICD-10-CM | POA: Diagnosis present

## 2016-09-25 DIAGNOSIS — J09X2 Influenza due to identified novel influenza A virus with other respiratory manifestations: Secondary | ICD-10-CM

## 2016-09-25 DIAGNOSIS — Z7951 Long term (current) use of inhaled steroids: Secondary | ICD-10-CM | POA: Diagnosis not present

## 2016-09-25 DIAGNOSIS — M1990 Unspecified osteoarthritis, unspecified site: Secondary | ICD-10-CM | POA: Diagnosis present

## 2016-09-25 DIAGNOSIS — I10 Essential (primary) hypertension: Secondary | ICD-10-CM | POA: Diagnosis not present

## 2016-09-25 DIAGNOSIS — K219 Gastro-esophageal reflux disease without esophagitis: Secondary | ICD-10-CM

## 2016-09-25 DIAGNOSIS — J111 Influenza due to unidentified influenza virus with other respiratory manifestations: Secondary | ICD-10-CM | POA: Diagnosis not present

## 2016-09-25 DIAGNOSIS — Z79891 Long term (current) use of opiate analgesic: Secondary | ICD-10-CM | POA: Diagnosis not present

## 2016-09-25 DIAGNOSIS — Z803 Family history of malignant neoplasm of breast: Secondary | ICD-10-CM

## 2016-09-25 DIAGNOSIS — Z96653 Presence of artificial knee joint, bilateral: Secondary | ICD-10-CM | POA: Diagnosis present

## 2016-09-25 DIAGNOSIS — Z8249 Family history of ischemic heart disease and other diseases of the circulatory system: Secondary | ICD-10-CM

## 2016-09-25 DIAGNOSIS — Z7982 Long term (current) use of aspirin: Secondary | ICD-10-CM | POA: Diagnosis not present

## 2016-09-25 DIAGNOSIS — Z791 Long term (current) use of non-steroidal anti-inflammatories (NSAID): Secondary | ICD-10-CM

## 2016-09-25 DIAGNOSIS — Z7901 Long term (current) use of anticoagulants: Secondary | ICD-10-CM | POA: Diagnosis not present

## 2016-09-25 DIAGNOSIS — R0603 Acute respiratory distress: Secondary | ICD-10-CM | POA: Diagnosis present

## 2016-09-25 DIAGNOSIS — Z825 Family history of asthma and other chronic lower respiratory diseases: Secondary | ICD-10-CM

## 2016-09-25 MED ORDER — ALBUTEROL SULFATE (2.5 MG/3ML) 0.083% IN NEBU
5.0000 mg | INHALATION_SOLUTION | Freq: Once | RESPIRATORY_TRACT | Status: AC
  Administered 2016-09-25: 5 mg via RESPIRATORY_TRACT
  Filled 2016-09-25: qty 6

## 2016-09-25 MED ORDER — IPRATROPIUM BROMIDE 0.02 % IN SOLN
0.5000 mg | Freq: Once | RESPIRATORY_TRACT | Status: AC
  Administered 2016-09-25: 0.5 mg via RESPIRATORY_TRACT
  Filled 2016-09-25: qty 2.5

## 2016-09-25 NOTE — ED Provider Notes (Signed)
[facility]-EMERGENCY DEPT Provider Note   CSN: [id number] Arrival date & time: 09/25/16  2237  By signing my name below, I, Octavia Heir, attest that this documentation has been prepared under the direction and in the presence of Brooke Payes, MD.  Electronically Signed: Octavia Heir, ED Scribe. 09/25/16. 11:31 PM.    History   Chief Complaint Chief Complaint  Patient presents with  . Shortness of Breath   The history is provided by the patient and the EMS personnel. No language interpreter was used.  Shortness of Breath  This is a recurrent problem. The problem occurs intermittently.The current episode started more than 1 week ago. The problem has been gradually worsening. Associated symptoms include cough and wheezing (at baseline). Pertinent negatives include no fever, no chest pain and no leg swelling. The problem's precipitants include medical treatment. Risk factors include recent leg injury. The treatment provided no relief. He has had prior hospitalizations. He has had no prior ED visits. He has had no prior ICU admissions. Associated medical issues include COPD.   HPI Comments: Ethan Harper is a 66 y.o. male brought in by ambulance, who has a PMhx of COPD, GERD, HTN, HLD, presents to the Emergency Department complaining of intermittent, moderate shortness of breath s/p a surgical procedure 2.5 weeks ago. Per chart review, pt had a left total knee arthroplasty on 09/06/16 performed by Dr. Beverely Low. Pt's symptoms became progressively worse tonight. He has been having associated chills x 1 day, sneezing and dry cough. Pt notes having wheezing at baseline. Pt was taken off anticoagulants last Thursday and has been taking baby aspirin since. He has been exposed to his son who had influenza and has been on tamiflu for 2.5 days. Pt denies fever.  Past Medical History:  Diagnosis Date  . Anxiety   . Bursitis   . Chronic sinusitis   . COPD (chronic obstructive pulmonary disease)  (HCC)   . Dysrhythmia   . Early cataracts, bilateral   . Full dentures   . GERD (gastroesophageal reflux disease)   . History of bronchitis   . History of hiatal hernia   . History of pneumonia   . Hyperlipidemia   . Hypertension   . Migraine   . Osteoarthritis   . Plantar fasciitis   . Shortness of breath dyspnea   . Sleep apnea    pt. refuses CPAP  . Urinary frequency   . Vitamin D deficiency   . Wears glasses     Patient Active Problem List   Diagnosis Date Noted  . H/O total knee replacement, left 09/06/2016  . S/P rotator cuff repair 06/08/2015  . Asthma 07/27/2012  . Dyspnea 07/27/2012    Past Surgical History:  Procedure Laterality Date  . CARDIAC CATHETERIZATION     12/15/2008 [facility]: Normal coronaries, NL LVF (Dr. Beverely Pace)  . CHOLECYSTECTOMY    . COLONOSCOPY    . ESOPHAGOGASTRODUODENOSCOPY    . EYE SURGERY Right 1987  . FINGER SURGERY Right    middle finger  . KNEE ARTHROSCOPY Bilateral    right x 4; left x 3  . SHOULDER ARTHROSCOPY Right   . SHOULDER ARTHROSCOPY WITH SUBACROMIAL DECOMPRESSION, ROTATOR CUFF REPAIR AND BICEP TENDON REPAIR Left 06/08/2015   Procedure: LEFT SHOULDER ARTHROSCOPY WITH SUBACROMIAL DECOMPRESSION, ROTATOR CUFF REPAIR ;  Surgeon: Francena Hanly, MD;  Location: [facility] OR;  Service: Orthopedics;  Laterality: Left;  . TOTAL KNEE ARTHROPLASTY Left 09/06/2016   Procedure: TOTAL KNEE ARTHROPLASTY;  Surgeon: Beverely Low, MD;  Location: [facility]  OR;  Service: Orthopedics;  Laterality: Left;       Home Medications    Prior to Admission medications   Medication Sig Start Date End Date Taking? Authorizing Provider  albuterol (PROVENTIL) (2.5 MG/3ML) 0.083% nebulizer solution Take 3 mLs (2.5 mg total) by nebulization every 4 (four) hours as needed for wheezing. 07/27/12   Nyoka Cowden, MD  amitriptyline (ELAVIL) 50 MG tablet Take 50 mg by mouth at bedtime.      Historical Provider, MD  amLODipine (NORVASC) 10 MG tablet Take 10 mg by mouth daily.       Historical Provider, MD  atorvastatin (LIPITOR) 10 MG tablet Take 10 mg by mouth at bedtime. 05/17/16   Historical Provider, MD  azelastine (ASTELIN) 137 MCG/SPRAY nasal spray Place 2 sprays into the nose daily as needed for allergies. Use in each nostril as directed    Historical Provider, MD  budesonide-formoterol (SYMBICORT) 160-4.5 MCG/ACT inhaler Take 2 puffs first thing in am and then another 2 puffs about 12 hours later. Patient taking differently: Inhale 2 puffs into the lungs 2 (two) times daily as needed (shortness of breath).  07/27/12   Nyoka Cowden, MD  enoxaparin (LOVENOX) 40 MG/0.4ML injection Inject 0.4 mLs (40 mg total) into the skin daily. 30 days post op 09/06/16   Beverely Low, MD  esomeprazole (NEXIUM) 40 MG capsule Take 40 mg by mouth daily before breakfast.      Historical Provider, MD  furosemide (LASIX) 20 MG tablet Take 20 mg by mouth daily as needed for fluid or edema.     Historical Provider, MD  HYDROcodone-acetaminophen (NORCO) 10-325 MG tablet Take 1-2 tablets by mouth every 4 (four) hours as needed for moderate pain. Patient taking differently: Take 1-2 tablets by mouth every 4 (four) hours as needed for moderate pain. Depends on pain level if takes 1-2 tablets 06/08/15   Ralene Bathe, PA-C  HYDROmorphone (DILAUDID) 2 MG tablet Take 1 tablet (2 mg total) by mouth every 4 (four) hours as needed for severe pain (do not use along with norco). Patient not taking: Reported on 08/21/2016 06/08/15   Ralene Bathe, PA-C  HYDROmorphone (DILAUDID) 2 MG tablet Take 1 tablet (2 mg total) by mouth every 6 (six) hours as needed for severe pain (breakthrough only). 09/09/16   Beverely Low, MD  losartan-hydrochlorothiazide (HYZAAR) 100-12.5 MG tablet Take 1 tablet by mouth daily. 06/07/16   Historical Provider, MD  lubiprostone (AMITIZA) 8 MCG capsule Take 8 mcg by mouth daily with breakfast.    Historical Provider, MD  meloxicam (MOBIC) 15 MG tablet Take 15 mg by mouth daily. Will stop  prior to procedure    Historical Provider, MD  methocarbamol (ROBAXIN) 500 MG tablet Take 1 tablet (500 mg total) by mouth 3 (three) times daily as needed. 09/06/16   Beverely Low, MD  montelukast (SINGULAIR) 10 MG tablet Take 10 mg by mouth at bedtime.    Historical Provider, MD  naloxegol oxalate (MOVANTIK) 25 MG TABS tablet Take 25 mg by mouth daily.    Historical Provider, MD  oxyCODONE-acetaminophen (ROXICET) 5-325 MG tablet Take 1-2 tablets by mouth every 4 (four) hours as needed for severe pain. 09/06/16   Beverely Low, MD  triamcinolone (NASACORT) 55 MCG/ACT nasal inhaler Place 2 sprays into the nose daily as needed (allergies).     Historical Provider, MD  warfarin (COUMADIN) 5 MG tablet Take 1 tablet (5 mg total) by mouth daily. Take as directed per the pharmacist for INR target  from 2.5-3.0 for 30 days post op 09/06/16   Beverely Low, MD  ZOLMitriptan (ZOMIG) 2.5 MG tablet Take 5 mg by mouth as needed. For migraine.  May repeat once after 2 hours.  Max 10mg /day     Historical Provider, MD  zolpidem (AMBIEN) 10 MG tablet Take 10 mg by mouth at bedtime.      Historical Provider, MD    Family History Family History  Problem Relation Age of Onset  . Breast cancer Mother     with mets to bone and lungs  . Emphysema Father     was a smoker  . Heart disease Father     Social History Social History  Substance Use Topics  . Smoking status: Never Smoker  . Smokeless tobacco: Never Used  . Alcohol use No     Allergies   Patient has no known allergies.   Review of Systems Review of Systems  Constitutional: Negative for fever.  Respiratory: Positive for cough, shortness of breath and wheezing (at baseline). Negative for stridor.   Cardiovascular: Negative for chest pain, palpitations and leg swelling.  All other systems reviewed and are negative.    Physical Exam Updated Vital Signs BP 135/95 (BP Location: Right Arm)   Pulse 111   Temp 99.1 F (37.3 C) (Oral)   Resp 20   Ht  5\' 11"  (1.803 m)   Wt 213 lb (96.6 kg)   SpO2 100%   BMI 29.71 kg/m   Physical Exam  Constitutional: He is oriented to person, place, and time. He appears well-developed and well-nourished.  HENT:  Head: Normocephalic and atraumatic.  Mouth/Throat: Oropharynx is clear and moist. No oropharyngeal exudate.  Moist mucous membranes. No exudates.   Eyes: Conjunctivae and EOM are normal. Pupils are equal, round, and reactive to light.  Neck: Normal range of motion. Neck supple. No JVD present. No tracheal deviation present.  No carotid bruits. Trachea midline.   Cardiovascular: Regular rhythm, normal heart sounds and intact distal pulses.  Tachycardia present.  Exam reveals no gallop and no friction rub.   No murmur heard. RRR.   Pulmonary/Chest: No stridor. No respiratory distress. He has decreased breath sounds. He has wheezes. He has no rhonchi. He has no rales.  Distant breath sounds diminished bilaterally   Abdominal: Soft. Bowel sounds are normal. He exhibits no distension. There is no rebound and no guarding.  Musculoskeletal: Normal range of motion. He exhibits no edema or tenderness.  Pre patellar effusion on left. Incision is clean and intact. No fluctuance, warmth, drainage, or erythema from incision site. No chords. All compartments are soft. Intact distal pulses.  Lymphadenopathy:    He has no cervical adenopathy.  Neurological: He is alert and oriented to person, place, and time. He has normal reflexes. He displays normal reflexes.  Skin: Skin is warm and dry. Capillary refill takes less than 2 seconds.  Psychiatric: He has a normal mood and affect.  Nursing note and vitals reviewed.    ED Treatments / Results  DIAGNOSTIC STUDIES: Oxygen Saturation is 100% on RA, normal by my interpretation.  COORDINATION OF CARE:  11:31 PM Discussed treatment plan with pt at bedside and pt agreed to plan.  Results for orders placed or performed during the hospital encounter of  09/25/16  CBC with Differential/Platelet  Result Value Ref Range   WBC 9.1 4.0 - 10.5 K/uL   RBC 4.86 4.22 - 5.81 MIL/uL   Hemoglobin 14.0 13.0 - 17.0 g/dL   HCT 41.5  39.0 - 52.0 %   MCV 85.4 78.0 - 100.0 fL   MCH 28.8 26.0 - 34.0 pg   MCHC 33.7 30.0 - 36.0 g/dL   RDW 91.412.7 78.211.5 - 95.615.5 %   Platelets 512 (H) 150 - 400 K/uL   Neutrophils Relative % 66 %   Neutro Abs 5.9 1.7 - 7.7 K/uL   Lymphocytes Relative 20 %   Lymphs Abs 1.8 0.7 - 4.0 K/uL   Monocytes Relative 12 %   Monocytes Absolute 1.1 (H) 0.1 - 1.0 K/uL   Eosinophils Relative 1 %   Eosinophils Absolute 0.1 0.0 - 0.7 K/uL   Basophils Relative 1 %   Basophils Absolute 0.1 0.0 - 0.1 K/uL  I-Stat Chem 8, ED  Result Value Ref Range   Sodium 139 135 - 145 mmol/L   Potassium 3.1 (L) 3.5 - 5.1 mmol/L   Chloride 100 (L) 101 - 111 mmol/L   BUN 7 6 - 20 mg/dL   Creatinine, Ser 2.131.10 0.61 - 1.24 mg/dL   Glucose, Bld 086100 (H) 65 - 99 mg/dL   Calcium, Ion 5.781.10 (L) 1.15 - 1.40 mmol/L   TCO2 23 0 - 100 mmol/L   Hemoglobin 14.6 13.0 - 17.0 g/dL   HCT 46.943.0 62.939.0 - 52.852.0 %  I[facility]Stat Troponin, [facility])  Result Value Ref Range   Troponin i, poc 0.00 0.00 - 0.08 ng/mL   Comment 3           Dg Chest 2 View  Result Date: 09/25/2016 CLINICAL DATA:  Initial evaluation for acute wheezing, COPD. EXAM: CHEST  2 VIEW COMPARISON:  Prior radiograph from 07/27/2012. FINDINGS: Cardiac and mediastinal silhouettes are stable in size and contour, and remain within normal limits. Tortuosity the intrathoracic aorta noted. Lungs are hypoinflated with secondary bibasilar bronchovascular crowding. No other focal infiltrates identified. Scattered peribronchial thickening like related COPD. No pulmonary edema or pleural effusion. No pneumothorax. No acute osseous abnormality. IMPRESSION: 1. Shallow lung inflation with secondary mild bibasilar bronchovascular crowding. No other active cardiopulmonary disease identified. 2. COPD. Electronically Signed   By:  Rise MuBenjamin  McClintock M.D.   On: 09/25/2016 23:53   Dg Knee Left Port  Result Date: 09/06/2016 CLINICAL DATA:  66 y/o  M; left knee replacement. EXAM: PORTABLE LEFT KNEE - 1-2 VIEW COMPARISON:  09/06/2016 left knee radiographs. FINDINGS: Postsurgical changes related to total knee replacement with patellar resurfacing and prosthesis. There is air within the joint space and soft tissues as well as edema. There is no hardware related complication or periprosthetic lucency identified. Vascular calcifications. No acute fracture. IMPRESSION: Expected postsurgical changes related to total knee replacement. No acute fracture identified. Electronically Signed   By: Mitzi HansenLance  Furusawa-Stratton M.D.   On: 09/06/2016 22:30   Dg Knee Left Port  Result Date: 09/06/2016 CLINICAL DATA:  Preoperative evaluation for knee replacement. Arthropathy. EXAM: PORTABLE LEFT KNEE - 1-2 VIEW COMPARISON:  None. FINDINGS: Frontal and lateral views were obtained. There is no fracture or dislocation. There is a moderate knee joint effusion. There is marked narrowing medially and in the patellofemoral joint region. There is spurring in all compartments. 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 There is atherosclerotic calcification in the distal superficial femoral artery and popliteal artery. IMPRESSION: Extensive arthropathy, most marked medially and in the patellofemoral joint. Moderate joint effusion. 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 No fracture or dislocation. There is arterial vascular calcifications/atherosclerosis. Electronically Signed   By: Bretta BangWilliam  Woodruff  III M.D.   On: 09/06/2016 14:30    ED ECG REPORT   Date: 09/26/2016  Rate: 103  Rhythm: sinus tachycardia  QRS Axis: left  Intervals: normal  ST/T Wave abnormalities: normal  Conduction Disutrbances:none  Narrative Interpretation:   Old EKG Reviewed: none available  I have personally reviewed the  EKG tracing and agree with the computerized printout as noted.   Radiology No results found.  Procedures Procedures (including critical care time)  Medications Ordered in ED  Medications  cefTRIAXone (ROCEPHIN) 1 g in dextrose 5 % 50 mL IVPB (1 g Intravenous New Bag/Given 09/26/16 0057)  azithromycin (ZITHROMAX) 500 mg in dextrose 5 % 250 mL IVPB (not administered)  methylPREDNISolone sodium succinate (SOLU-MEDROL) 125 mg/2 mL injection 125 mg (not administered)  albuterol (PROVENTIL) (2.5 MG/3ML) 0.083% nebulizer solution 5 mg (5 mg Nebulization Given 09/25/16 2348)  ipratropium (ATROVENT) nebulizer solution 0.5 mg (0.5 mg Nebulization Given 09/25/16 2348)  methocarbamol (ROBAXIN) tablet 1,000 mg (1,000 mg Oral Given 09/26/16 0056)     Final Clinical Impressions(s) / ED Diagnoses  COPD exacerbation:  Will need admission.    I personally performed the services described in this documentation, which was scribed in my presence. The recorded information has been reviewed and is accurate.      Cy Blamer, MD 09/26/16 [phone]

## 2016-09-25 NOTE — ED Triage Notes (Signed)
Per Pt, called EMS d/t not being able to catch breath.  Pt tried at-home breathing treatment and a "nose strip".  Pt has been having intermittent chills since Jan 5th (when he had a knee replacement" and also has a non-productive cough. Pt is also c/o sneezing and a "Pounding headache".  Pt says he was exposed to the flu over the weekend.

## 2016-09-25 NOTE — ED Notes (Signed)
Bed: WA06 Expected date:  Expected time:  Means of arrival:  Comments: 66 yo M  Chills

## 2016-09-25 NOTE — ED Notes (Signed)
Pt transported to XR.  

## 2016-09-26 ENCOUNTER — Inpatient Hospital Stay (HOSPITAL_COMMUNITY): Payer: MEDICARE

## 2016-09-26 ENCOUNTER — Encounter (HOSPITAL_COMMUNITY): Payer: Self-pay | Admitting: Emergency Medicine

## 2016-09-26 DIAGNOSIS — K219 Gastro-esophageal reflux disease without esophagitis: Secondary | ICD-10-CM | POA: Diagnosis not present

## 2016-09-26 DIAGNOSIS — M722 Plantar fascial fibromatosis: Secondary | ICD-10-CM | POA: Diagnosis not present

## 2016-09-26 DIAGNOSIS — Z7951 Long term (current) use of inhaled steroids: Secondary | ICD-10-CM | POA: Diagnosis not present

## 2016-09-26 DIAGNOSIS — Z79891 Long term (current) use of opiate analgesic: Secondary | ICD-10-CM | POA: Diagnosis not present

## 2016-09-26 DIAGNOSIS — I10 Essential (primary) hypertension: Secondary | ICD-10-CM | POA: Diagnosis not present

## 2016-09-26 DIAGNOSIS — J441 Chronic obstructive pulmonary disease with (acute) exacerbation: Secondary | ICD-10-CM | POA: Diagnosis present

## 2016-09-26 DIAGNOSIS — M1990 Unspecified osteoarthritis, unspecified site: Secondary | ICD-10-CM | POA: Diagnosis not present

## 2016-09-26 DIAGNOSIS — R0602 Shortness of breath: Secondary | ICD-10-CM | POA: Diagnosis present

## 2016-09-26 DIAGNOSIS — I482 Chronic atrial fibrillation, unspecified: Secondary | ICD-10-CM

## 2016-09-26 DIAGNOSIS — Z7982 Long term (current) use of aspirin: Secondary | ICD-10-CM | POA: Diagnosis not present

## 2016-09-26 DIAGNOSIS — Z66 Do not resuscitate: Secondary | ICD-10-CM | POA: Diagnosis not present

## 2016-09-26 DIAGNOSIS — E559 Vitamin D deficiency, unspecified: Secondary | ICD-10-CM | POA: Diagnosis not present

## 2016-09-26 DIAGNOSIS — E785 Hyperlipidemia, unspecified: Secondary | ICD-10-CM | POA: Diagnosis not present

## 2016-09-26 DIAGNOSIS — R0603 Acute respiratory distress: Secondary | ICD-10-CM | POA: Diagnosis not present

## 2016-09-26 DIAGNOSIS — G43909 Migraine, unspecified, not intractable, without status migrainosus: Secondary | ICD-10-CM | POA: Diagnosis not present

## 2016-09-26 DIAGNOSIS — Z79899 Other long term (current) drug therapy: Secondary | ICD-10-CM | POA: Diagnosis not present

## 2016-09-26 DIAGNOSIS — Z7901 Long term (current) use of anticoagulants: Secondary | ICD-10-CM | POA: Diagnosis not present

## 2016-09-26 DIAGNOSIS — J111 Influenza due to unidentified influenza virus with other respiratory manifestations: Secondary | ICD-10-CM | POA: Diagnosis not present

## 2016-09-26 DIAGNOSIS — Z96653 Presence of artificial knee joint, bilateral: Secondary | ICD-10-CM | POA: Diagnosis not present

## 2016-09-26 LAB — I-STAT CHEM 8, ED
BUN: 7 mg/dL (ref 6–20)
CALCIUM ION: 1.1 mmol/L — AB (ref 1.15–1.40)
Chloride: 100 mmol/L — ABNORMAL LOW (ref 101–111)
Creatinine, Ser: 1.1 mg/dL (ref 0.61–1.24)
GLUCOSE: 100 mg/dL — AB (ref 65–99)
HCT: 43 % (ref 39.0–52.0)
HEMOGLOBIN: 14.6 g/dL (ref 13.0–17.0)
Potassium: 3.1 mmol/L — ABNORMAL LOW (ref 3.5–5.1)
Sodium: 139 mmol/L (ref 135–145)
TCO2: 23 mmol/L (ref 0–100)

## 2016-09-26 LAB — INFLUENZA PANEL BY PCR (TYPE A & B)
Influenza A By PCR: POSITIVE — AB
Influenza B By PCR: NEGATIVE

## 2016-09-26 LAB — CBC
HCT: 41.4 % (ref 39.0–52.0)
Hemoglobin: 13.9 g/dL (ref 13.0–17.0)
MCH: 29 pg (ref 26.0–34.0)
MCHC: 33.6 g/dL (ref 30.0–36.0)
MCV: 86.3 fL (ref 78.0–100.0)
PLATELETS: 532 10*3/uL — AB (ref 150–400)
RBC: 4.8 MIL/uL (ref 4.22–5.81)
RDW: 13.2 % (ref 11.5–15.5)
WBC: 4.2 10*3/uL (ref 4.0–10.5)

## 2016-09-26 LAB — CBC WITH DIFFERENTIAL/PLATELET
BASOS ABS: 0.1 10*3/uL (ref 0.0–0.1)
BASOS PCT: 1 %
EOS ABS: 0.1 10*3/uL (ref 0.0–0.7)
Eosinophils Relative: 1 %
HEMATOCRIT: 41.5 % (ref 39.0–52.0)
HEMOGLOBIN: 14 g/dL (ref 13.0–17.0)
Lymphocytes Relative: 20 %
Lymphs Abs: 1.8 10*3/uL (ref 0.7–4.0)
MCH: 28.8 pg (ref 26.0–34.0)
MCHC: 33.7 g/dL (ref 30.0–36.0)
MCV: 85.4 fL (ref 78.0–100.0)
Monocytes Absolute: 1.1 10*3/uL — ABNORMAL HIGH (ref 0.1–1.0)
Monocytes Relative: 12 %
NEUTROS ABS: 5.9 10*3/uL (ref 1.7–7.7)
NEUTROS PCT: 66 %
PLATELETS: 512 10*3/uL — AB (ref 150–400)
RBC: 4.86 MIL/uL (ref 4.22–5.81)
RDW: 12.7 % (ref 11.5–15.5)
WBC: 9.1 10*3/uL (ref 4.0–10.5)

## 2016-09-26 LAB — CREATININE, SERUM
CREATININE: 1.15 mg/dL (ref 0.61–1.24)
GFR calc Af Amer: >60 mL/min (ref >60)
GFR calc non Af Amer: >60 mL/min (ref >60)

## 2016-09-26 LAB — I-STAT TROPONIN, ED: TROPONIN I, POC: 0 ng/mL (ref 0.00–0.08)

## 2016-09-26 LAB — PROTIME-INR
INR: 1.06
Prothrombin Time: 13.8 seconds (ref 11.4–15.2)

## 2016-09-26 MED ORDER — BENZONATATE 100 MG PO CAPS
100.0000 mg | ORAL_CAPSULE | Freq: Three times a day (TID) | ORAL | Status: DC
  Administered 2016-09-26 – 2016-09-27 (×4): 100 mg via ORAL
  Filled 2016-09-26 (×4): qty 1

## 2016-09-26 MED ORDER — LEVALBUTEROL HCL 1.25 MG/0.5ML IN NEBU
1.2500 mg | INHALATION_SOLUTION | Freq: Three times a day (TID) | RESPIRATORY_TRACT | Status: DC
  Administered 2016-09-27: 1.25 mg via RESPIRATORY_TRACT
  Filled 2016-09-26 (×2): qty 0.5

## 2016-09-26 MED ORDER — SODIUM CHLORIDE 0.9 % IV BOLUS (SEPSIS): 1000.0000 mL | Freq: Once | INTRAVENOUS | Status: DC

## 2016-09-26 MED ORDER — LOSARTAN POTASSIUM 50 MG PO TABS
100.0000 mg | ORAL_TABLET | Freq: Every day | ORAL | Status: DC
  Administered 2016-09-26 – 2016-09-27 (×2): 100 mg via ORAL
  Filled 2016-09-26 (×2): qty 2

## 2016-09-26 MED ORDER — CYCLOBENZAPRINE HCL 5 MG PO TABS
5.0000 mg | ORAL_TABLET | Freq: Three times a day (TID) | ORAL | Status: DC | PRN
  Administered 2016-09-26 – 2016-09-27 (×3): 5 mg via ORAL
  Filled 2016-09-26 (×3): qty 1

## 2016-09-26 MED ORDER — IPRATROPIUM BROMIDE 0.02 % IN SOLN
0.5000 mg | Freq: Four times a day (QID) | RESPIRATORY_TRACT | Status: DC
  Administered 2016-09-26 (×2): 0.5 mg via RESPIRATORY_TRACT
  Filled 2016-09-26 (×2): qty 2.5

## 2016-09-26 MED ORDER — ATORVASTATIN CALCIUM 10 MG PO TABS
10.0000 mg | ORAL_TABLET | Freq: Every day | ORAL | Status: DC
  Administered 2016-09-26: 10 mg via ORAL
  Filled 2016-09-26: qty 1

## 2016-09-26 MED ORDER — MOMETASONE FURO-FORMOTEROL FUM 200-5 MCG/ACT IN AERO
2.0000 | INHALATION_SPRAY | Freq: Two times a day (BID) | RESPIRATORY_TRACT | Status: DC
  Administered 2016-09-26 – 2016-09-27 (×3): 2 via RESPIRATORY_TRACT
  Filled 2016-09-26: qty 8.8

## 2016-09-26 MED ORDER — ASPIRIN EC 81 MG PO TBEC
81.0000 mg | DELAYED_RELEASE_TABLET | Freq: Two times a day (BID) | ORAL | Status: DC
  Administered 2016-09-26 – 2016-09-27 (×3): 81 mg via ORAL
  Filled 2016-09-26 (×3): qty 1

## 2016-09-26 MED ORDER — IPRATROPIUM BROMIDE 0.02 % IN SOLN
0.5000 mg | Freq: Three times a day (TID) | RESPIRATORY_TRACT | Status: DC
  Administered 2016-09-27: 0.5 mg via RESPIRATORY_TRACT
  Filled 2016-09-26: qty 2.5

## 2016-09-26 MED ORDER — NALOXEGOL OXALATE 25 MG PO TABS
25.0000 mg | ORAL_TABLET | Freq: Every day | ORAL | Status: DC
  Administered 2016-09-26 – 2016-09-27 (×2): 25 mg via ORAL
  Filled 2016-09-26 (×2): qty 1

## 2016-09-26 MED ORDER — LOSARTAN POTASSIUM-HCTZ 100-12.5 MG PO TABS: 1.0000 | ORAL_TABLET | Freq: Every day | ORAL | Status: DC

## 2016-09-26 MED ORDER — LEVALBUTEROL HCL 1.25 MG/0.5ML IN NEBU
1.2500 mg | INHALATION_SOLUTION | Freq: Four times a day (QID) | RESPIRATORY_TRACT | Status: DC
Start: 2016-09-26 — End: 2016-09-26
  Administered 2016-09-26 (×2): 1.25 mg via RESPIRATORY_TRACT
  Filled 2016-09-26 (×4): qty 0.5

## 2016-09-26 MED ORDER — DM-GUAIFENESIN ER 30-600 MG PO TB12
1.0000 | ORAL_TABLET | Freq: Two times a day (BID) | ORAL | Status: DC
  Administered 2016-09-26 – 2016-09-27 (×3): 1 via ORAL
  Filled 2016-09-26 (×3): qty 1

## 2016-09-26 MED ORDER — AMLODIPINE BESYLATE 10 MG PO TABS
10.0000 mg | ORAL_TABLET | Freq: Every day | ORAL | Status: DC
  Administered 2016-09-26 – 2016-09-27 (×2): 10 mg via ORAL
  Filled 2016-09-26 (×2): qty 1

## 2016-09-26 MED ORDER — LEVALBUTEROL HCL 0.63 MG/3ML IN NEBU: 0.6300 mg | INHALATION_SOLUTION | RESPIRATORY_TRACT | Status: DC | PRN

## 2016-09-26 MED ORDER — DEXTROSE 5 % IV SOLN
1.0000 g | Freq: Once | INTRAVENOUS | Status: AC
  Administered 2016-09-26: 1 g via INTRAVENOUS
  Filled 2016-09-26: qty 10

## 2016-09-26 MED ORDER — ZOLPIDEM TARTRATE 10 MG PO TABS: 10.0000 mg | ORAL_TABLET | Freq: Every day | ORAL | Status: DC

## 2016-09-26 MED ORDER — ACETAMINOPHEN 325 MG PO TABS: 650.0000 mg | ORAL_TABLET | Freq: Four times a day (QID) | ORAL | Status: DC | PRN

## 2016-09-26 MED ORDER — MEPERIDINE HCL 50 MG PO TABS
50.0000 mg | ORAL_TABLET | ORAL | Status: DC | PRN
  Administered 2016-09-26: 50 mg via ORAL
  Administered 2016-09-26: 100 mg via ORAL
  Administered 2016-09-27: 50 mg via ORAL
  Filled 2016-09-26 (×3): qty 1

## 2016-09-26 MED ORDER — DIPHENHYDRAMINE HCL 25 MG PO CAPS
25.0000 mg | ORAL_CAPSULE | Freq: Once | ORAL | Status: AC
  Administered 2016-09-26: 25 mg via ORAL
  Filled 2016-09-26: qty 1

## 2016-09-26 MED ORDER — KETOROLAC TROMETHAMINE 10 MG PO TABS
30.0000 mg | ORAL_TABLET | Freq: Once | ORAL | Status: AC
  Administered 2016-09-26: 30 mg via ORAL
  Filled 2016-09-26: qty 3

## 2016-09-26 MED ORDER — ONDANSETRON HCL 4 MG/2ML IJ SOLN: 4.0000 mg | Freq: Four times a day (QID) | INTRAMUSCULAR | Status: DC | PRN

## 2016-09-26 MED ORDER — HYDROCHLOROTHIAZIDE 12.5 MG PO CAPS
12.5000 mg | ORAL_CAPSULE | Freq: Every day | ORAL | Status: DC
  Administered 2016-09-26 – 2016-09-27 (×2): 12.5 mg via ORAL
  Filled 2016-09-26 (×2): qty 1

## 2016-09-26 MED ORDER — ACETAMINOPHEN 325 MG PO TABS
650.0000 mg | ORAL_TABLET | Freq: Four times a day (QID) | ORAL | Status: DC | PRN
  Administered 2016-09-26: 650 mg via ORAL
  Filled 2016-09-26: qty 2

## 2016-09-26 MED ORDER — METHYLPREDNISOLONE SODIUM SUCC 40 MG IJ SOLR
40.0000 mg | Freq: Two times a day (BID) | INTRAMUSCULAR | Status: DC
  Administered 2016-09-26 – 2016-09-27 (×2): 40 mg via INTRAVENOUS
  Filled 2016-09-26 (×2): qty 1

## 2016-09-26 MED ORDER — ZOLPIDEM TARTRATE 10 MG PO TABS
10.0000 mg | ORAL_TABLET | Freq: Every day | ORAL | Status: DC
  Administered 2016-09-26: 10 mg via ORAL
  Filled 2016-09-26: qty 1

## 2016-09-26 MED ORDER — HYDROCOD POLST-CPM POLST ER 10-8 MG/5ML PO SUER
5.0000 mL | Freq: Two times a day (BID) | ORAL | Status: DC
  Administered 2016-09-26 – 2016-09-27 (×3): 5 mL via ORAL
  Filled 2016-09-26 (×4): qty 5

## 2016-09-26 MED ORDER — HYDRALAZINE HCL 20 MG/ML IJ SOLN: 5.0000 mg | INTRAMUSCULAR | Status: DC | PRN

## 2016-09-26 MED ORDER — LUBIPROSTONE 8 MCG PO CAPS
8.0000 ug | ORAL_CAPSULE | Freq: Every day | ORAL | Status: DC
  Administered 2016-09-26 – 2016-09-27 (×2): 8 ug via ORAL
  Filled 2016-09-26 (×3): qty 1

## 2016-09-26 MED ORDER — PANTOPRAZOLE SODIUM 40 MG PO TBEC: 40.0000 mg | DELAYED_RELEASE_TABLET | Freq: Every day | ORAL | Status: DC

## 2016-09-26 MED ORDER — METOCLOPRAMIDE HCL 10 MG PO TABS
10.0000 mg | ORAL_TABLET | Freq: Once | ORAL | Status: AC
  Administered 2016-09-26: 10 mg via ORAL
  Filled 2016-09-26: qty 1

## 2016-09-26 MED ORDER — LEVALBUTEROL HCL 1.25 MG/0.5ML IN NEBU
1.2500 mg | INHALATION_SOLUTION | Freq: Three times a day (TID) | RESPIRATORY_TRACT | Status: DC
  Administered 2016-09-26: 1.25 mg via RESPIRATORY_TRACT
  Filled 2016-09-26: qty 0.5

## 2016-09-26 MED ORDER — ONDANSETRON HCL 4 MG PO TABS: 4.0000 mg | ORAL_TABLET | Freq: Four times a day (QID) | ORAL | Status: DC | PRN

## 2016-09-26 MED ORDER — FLUTICASONE PROPIONATE 50 MCG/ACT NA SUSP
2.0000 | Freq: Every day | NASAL | Status: DC
  Filled 2016-09-26: qty 16

## 2016-09-26 MED ORDER — AMITRIPTYLINE HCL 25 MG PO TABS
50.0000 mg | ORAL_TABLET | Freq: Every day | ORAL | Status: DC
  Administered 2016-09-26: 50 mg via ORAL
  Filled 2016-09-26: qty 2

## 2016-09-26 MED ORDER — METHOCARBAMOL 500 MG PO TABS
1000.0000 mg | ORAL_TABLET | Freq: Once | ORAL | Status: AC
  Administered 2016-09-26: 1000 mg via ORAL
  Filled 2016-09-26: qty 2

## 2016-09-26 MED ORDER — ZOLMITRIPTAN 2.5 MG PO TABS: 5.0000 mg | ORAL_TABLET | Freq: Every day | ORAL | Status: DC | PRN

## 2016-09-26 MED ORDER — METHYLPREDNISOLONE SODIUM SUCC 125 MG IJ SOLR
125.0000 mg | Freq: Once | INTRAMUSCULAR | Status: AC
  Administered 2016-09-26: 125 mg via INTRAVENOUS
  Filled 2016-09-26: qty 2

## 2016-09-26 MED ORDER — IPRATROPIUM BROMIDE 0.02 % IN SOLN
0.5000 mg | Freq: Three times a day (TID) | RESPIRATORY_TRACT | Status: DC
  Administered 2016-09-26: 0.5 mg via RESPIRATORY_TRACT
  Filled 2016-09-26: qty 2.5

## 2016-09-26 MED ORDER — AZITHROMYCIN 250 MG PO TABS
250.0000 mg | ORAL_TABLET | Freq: Every day | ORAL | Status: DC
  Administered 2016-09-26: 250 mg via ORAL
  Filled 2016-09-26: qty 1

## 2016-09-26 MED ORDER — SODIUM CHLORIDE 0.9% FLUSH: 3.0000 mL | INTRAVENOUS | Status: DC | PRN

## 2016-09-26 MED ORDER — PANTOPRAZOLE SODIUM 40 MG PO TBEC
40.0000 mg | DELAYED_RELEASE_TABLET | Freq: Every day | ORAL | Status: DC
  Administered 2016-09-26 – 2016-09-27 (×2): 40 mg via ORAL
  Filled 2016-09-26 (×2): qty 1

## 2016-09-26 MED ORDER — SODIUM CHLORIDE 0.9 % IV SOLN: 250.0000 mL | INTRAVENOUS | Status: DC | PRN

## 2016-09-26 MED ORDER — TRAMADOL HCL 50 MG PO TABS
50.0000 mg | ORAL_TABLET | Freq: Once | ORAL | Status: AC
  Administered 2016-09-26: 50 mg via ORAL
  Filled 2016-09-26: qty 1

## 2016-09-26 MED ORDER — HEPARIN SODIUM (PORCINE) 5000 UNIT/ML IJ SOLN
5000.0000 [IU] | Freq: Three times a day (TID) | INTRAMUSCULAR | Status: DC
  Administered 2016-09-26 – 2016-09-27 (×3): 5000 [IU] via SUBCUTANEOUS
  Filled 2016-09-26 (×3): qty 1

## 2016-09-26 MED ORDER — IPRATROPIUM BROMIDE 0.02 % IN SOLN
0.5000 mg | RESPIRATORY_TRACT | Status: DC
  Administered 2016-09-26 (×2): 0.5 mg via RESPIRATORY_TRACT
  Filled 2016-09-26 (×2): qty 2.5

## 2016-09-26 MED ORDER — ACETAMINOPHEN 650 MG RE SUPP: 650.0000 mg | Freq: Four times a day (QID) | RECTAL | Status: DC | PRN

## 2016-09-26 MED ORDER — SUMATRIPTAN SUCCINATE 100 MG PO TABS
100.0000 mg | ORAL_TABLET | ORAL | Status: DC | PRN
  Administered 2016-09-26 (×2): 100 mg via ORAL
  Filled 2016-09-26 (×4): qty 1

## 2016-09-26 MED ORDER — AZITHROMYCIN 500 MG IV SOLR
500.0000 mg | Freq: Once | INTRAVENOUS | Status: AC
  Administered 2016-09-26: 500 mg via INTRAVENOUS
  Filled 2016-09-26: qty 500

## 2016-09-26 MED ORDER — LEVALBUTEROL HCL 1.25 MG/0.5ML IN NEBU
1.2500 mg | INHALATION_SOLUTION | Freq: Four times a day (QID) | RESPIRATORY_TRACT | Status: DC
  Administered 2016-09-26: 1.25 mg via RESPIRATORY_TRACT
  Filled 2016-09-26: qty 0.5

## 2016-09-26 MED ORDER — POTASSIUM CHLORIDE 20 MEQ/15ML (10%) PO SOLN
40.0000 meq | Freq: Once | ORAL | Status: AC
  Administered 2016-09-26: 40 meq via ORAL
  Filled 2016-09-26: qty 30

## 2016-09-26 MED ORDER — CHLORHEXIDINE GLUCONATE 0.12 % MT SOLN
15.0000 mL | Freq: Two times a day (BID) | OROMUCOSAL | Status: DC
  Administered 2016-09-26 – 2016-09-27 (×3): 15 mL via OROMUCOSAL
  Filled 2016-09-26 (×3): qty 15

## 2016-09-26 MED ORDER — ORAL CARE MOUTH RINSE
15.0000 mL | Freq: Two times a day (BID) | OROMUCOSAL | Status: DC
  Administered 2016-09-26: 15 mL via OROMUCOSAL

## 2016-09-26 MED ORDER — NON FORMULARY: 5.0000 mg | Freq: Every day | Status: DC | PRN

## 2016-09-26 MED ORDER — ONDANSETRON HCL 4 MG/2ML IJ SOLN: 4.0000 mg | Freq: Three times a day (TID) | INTRAMUSCULAR | Status: DC | PRN

## 2016-09-26 MED ORDER — SODIUM CHLORIDE 0.9% FLUSH
3.0000 mL | Freq: Two times a day (BID) | INTRAVENOUS | Status: DC
  Administered 2016-09-26 – 2016-09-27 (×3): 3 mL via INTRAVENOUS

## 2016-09-26 MED ORDER — MONTELUKAST SODIUM 10 MG PO TABS
10.0000 mg | ORAL_TABLET | Freq: Every day | ORAL | Status: DC
  Administered 2016-09-26: 10 mg via ORAL
  Filled 2016-09-26: qty 1

## 2016-09-26 NOTE — ED Notes (Signed)
Pt calm and cooperative at this time.

## 2016-09-26 NOTE — H&P (Signed)
History and Physical    Ethan Harper ZOX:[id number] DOB: 22-Jan-1951 DOA: 09/25/2016    PCP: Ethan Rossetti, MD  Patient coming from: home  Chief Complaint: home  HPI: Ethan Harper is a 66 y.o. 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 He has developed severe dyspnea as well. No fevers or chills. No sputum production. Found to have respiratory distress in ER ad required 4 neb treatment. By the time of my eval, he is comfortable and not dyspneic. He has been exposed to family members with influenza and has been taking prophylactic Tamiflu.   ED Course: Given Solumedrol, Rocephin, Zithromax and neb treatments fpr wheezing.  Review of Systems:  Currently has a headache. No other complaints.  All other systems reviewed and apart from HPI, are negative.  Past Medical History:  Diagnosis Date  . Anxiety   . Bursitis   . Chronic sinusitis   . COPD (chronic obstructive pulmonary disease) (HCC)   . Dysrhythmia   . Early cataracts, bilateral   . Full dentures   . GERD (gastroesophageal reflux disease)   . History of bronchitis   . History of hiatal hernia   . History of pneumonia   . Hyperlipidemia   . Hypertension   . Migraine   . Osteoarthritis   . Plantar fasciitis   . Shortness of breath dyspnea   . Sleep apnea    pt. refuses CPAP  . Urinary frequency   . Vitamin D deficiency   . Wears glasses     Past Surgical History:  Procedure Laterality Date  . CARDIAC CATHETERIZATION     12/15/2008 [facility]: Normal coronaries, NL LVF (Dr. Beverely Harper)  . CHOLECYSTECTOMY    . COLONOSCOPY    . ESOPHAGOGASTRODUODENOSCOPY    . EYE SURGERY Right 1987  . FINGER SURGERY Right    middle finger  . KNEE ARTHROSCOPY Bilateral    right x 4; left x 3  . SHOULDER ARTHROSCOPY Right   . SHOULDER ARTHROSCOPY WITH SUBACROMIAL DECOMPRESSION, ROTATOR CUFF REPAIR AND BICEP TENDON REPAIR Left  06/08/2015   Procedure: LEFT SHOULDER ARTHROSCOPY WITH SUBACROMIAL DECOMPRESSION, ROTATOR CUFF REPAIR ;  Surgeon: Ethan Hanly, MD;  Location: [facility] OR;  Service: Orthopedics;  Laterality: Left;  . TOTAL KNEE ARTHROPLASTY Left 09/06/2016   Procedure: TOTAL KNEE ARTHROPLASTY;  Surgeon: Ethan Low, MD;  Location: [facility] OR;  Service: Orthopedics;  Laterality: Left;    Social History:   reports that he has never smoked. He has never used smokeless tobacco. He reports that he does not drink alcohol or use drugs.  No Known Allergies  Family History  Problem Relation Age of Onset  . Breast cancer Mother     with mets to bone and lungs  . Emphysema Father     was a smoker  . Heart disease Father      Prior to Admission medications   Medication Sig Start Date End Date Taking? Authorizing Provider  albuterol (PROVENTIL) (2.5 MG/3ML) 0.083% nebulizer solution Take 3 mLs (2.5 mg total) by nebulization every 4 (four) hours as needed for wheezing. 07/27/12  Yes Ethan Cowden, MD  amitriptyline (ELAVIL) 50 MG tablet Take 50 mg by mouth at bedtime.     Yes Historical Provider, MD  amLODipine (NORVASC) 10 MG tablet Take 10 mg by mouth daily.     Yes Historical Provider, MD  aspirin EC 81 MG tablet  Take 81 mg by mouth 2 (two) times daily.   Yes Historical Provider, MD  atorvastatin (LIPITOR) 10 MG tablet Take 10 mg by mouth at bedtime. 05/17/16  Yes Historical Provider, MD  azelastine (ASTELIN) 137 MCG/SPRAY nasal spray Place 2 sprays into the nose daily as needed for allergies. Use in each nostril as directed   Yes Historical Provider, MD  budesonide-formoterol (SYMBICORT) 160-4.5 MCG/ACT inhaler Take 2 puffs first thing in am and then another 2 puffs about 12 hours later. Patient taking differently: Inhale 2 puffs into the lungs 2 (two) times daily as needed (shortness of breath).  07/27/12  Yes Ethan Cowden, MD  esomeprazole (NEXIUM) 40 MG capsule Take 40 mg by mouth daily before breakfast.     Yes  Historical Provider, MD  furosemide (LASIX) 20 MG tablet Take 20 mg by mouth daily as needed for fluid or edema.    Yes Historical Provider, MD  losartan-hydrochlorothiazide (HYZAAR) 100-12.5 MG tablet Take 1 tablet by mouth daily. 06/07/16  Yes Historical Provider, MD  lubiprostone (AMITIZA) 8 MCG capsule Take 8 mcg by mouth daily with breakfast.   Yes Historical Provider, MD  meperidine (DEMEROL) 50 MG tablet Take 50-100 mg by mouth every 4 (four) hours as needed for moderate pain.  09/19/16  Yes Historical Provider, MD  montelukast (SINGULAIR) 10 MG tablet Take 10 mg by mouth at bedtime.   Yes Historical Provider, MD  naloxegol oxalate (MOVANTIK) 25 MG TABS tablet Take 25 mg by mouth daily.   Yes Historical Provider, MD  ZOLMitriptan (ZOMIG) 2.5 MG tablet Take 5 mg by mouth as needed. For migraine.  May repeat once after 2 hours.  Max 10mg /day    Yes Historical Provider, MD  zolpidem (AMBIEN) 10 MG tablet Take 10 mg by mouth at bedtime.     Yes Historical Provider, MD  enoxaparin (LOVENOX) 40 MG/0.4ML injection Inject 0.4 mLs (40 mg total) into the skin daily. 30 days post op Patient not taking: Reported on 09/26/2016 09/06/16   Ethan Low, MD  HYDROcodone-acetaminophen [facility]) 10-325 MG tablet Take 1-2 tablets by mouth every 4 (four) hours as needed for moderate pain. Patient not taking: Reported on 09/26/2016 06/08/15   Ralene Bathe, PA-C  HYDROmorphone (DILAUDID) 2 MG tablet Take 1 tablet (2 mg total) by mouth every 4 (four) hours as needed for severe pain (do not use along with norco). Patient not taking: Reported on 08/21/2016 06/08/15   Ralene Bathe, PA-C  HYDROmorphone (DILAUDID) 2 MG tablet Take 1 tablet (2 mg total) by mouth every 6 (six) hours as needed for severe pain (breakthrough only). Patient not taking: Reported on 09/26/2016 09/09/16   Ethan Low, MD  methocarbamol (ROBAXIN) 500 MG tablet Take 1 tablet (500 mg total) by mouth 3 (three) times daily as needed. Patient not taking:  Reported on 09/26/2016 09/06/16   Ethan Low, MD  oxyCODONE-acetaminophen (ROXICET) 5-325 MG tablet Take 1-2 tablets by mouth every 4 (four) hours as needed for severe pain. Patient not taking: Reported on 09/26/2016 09/06/16   Ethan Low, MD  triamcinolone (NASACORT) 55 MCG/ACT nasal inhaler Place 2 sprays into the nose daily as needed (allergies).     Historical Provider, MD  warfarin (COUMADIN) 5 MG tablet Take 1 tablet (5 mg total) by mouth daily. Take as directed per the pharmacist for INR target from 2.5-3.0 for 30 days post op Patient not taking: Reported on 09/26/2016 09/06/16   Ethan Low, MD    Physical Exam: Vitals:   09/26/16  [id number] 09/26/16 0549 09/26/16 0731 09/26/16 1353  BP: 129/90     Pulse: (!) 104     Resp: 20     Temp: 99 F (37.2 C)     TempSrc: Oral     SpO2: 97%  97% 96%  Weight:  96.6 kg (213 lb)    Height:  5\' 11"  (1.803 m)        Constitutional: NAD, calm, comfortable Eyes: PERTLA, lids and conjunctivae normal ENMT: Mucous membranes are moist. Posterior pharynx clear of any exudate or lesions. Normal dentition.  Neck: normal, supple, no masses, no thyromegaly Respiratory: clear to auscultation bilaterally, no wheezing, no crackles. Normal respiratory effort. No accessory muscle use.  Cardiovascular: S1 & S2 heard, regular rate and rhythm, no murmurs / rubs / gallops. No extremity edema. 2+ pedal pulses. No carotid bruits.  Abdomen: No distension, no tenderness, no masses palpated. No hepatosplenomegaly. Bowel sounds normal.  Musculoskeletal: no clubbing / cyanosis. No joint deformity upper and lower extremities. Good ROM, no contractures. Normal muscle tone.  Skin: no rashes, lesions, ulcers. No induration Neurologic: CN 2-12 grossly intact. Sensation intact, DTR normal. Strength 5/5 in all 4 limbs.  Psychiatric: Normal judgment and insight. Alert and oriented x 3. Normal mood.     Labs on Admission: I have personally reviewed following labs and imaging  studies  CBC:  Recent Labs Lab 09/25/16 2343 09/26/16 0001 09/26/16 0833  WBC 9.1  --  4.2  NEUTROABS 5.9  --   --   HGB 14.0 14.6 13.9  HCT 41.5 43.0 41.4  MCV 85.4  --  86.3  PLT 512*  --  532*   Basic Metabolic Panel:  Recent Labs Lab 09/26/16 0001 09/26/16 0833  NA 139  --   K 3.1*  --   CL 100*  --   GLUCOSE 100*  --   BUN 7  --   CREATININE 1.10 1.15   GFR: Estimated Creatinine Clearance: 74.9 mL/min (by C-G formula based on SCr of 1.15 mg/dL). Liver Function Tests: No results for input(s): AST, ALT, ALKPHOS, BILITOT, PROT, ALBUMIN in the last 168 hours. No results for input(s): LIPASE, AMYLASE in the last 168 hours. No results for input(s): AMMONIA in the last 168 hours. Coagulation Profile:  Recent Labs Lab 09/25/16 2343  INR 1.06   Cardiac Enzymes: No results for input(s): CKTOTAL, CKMB, CKMBINDEX, TROPONINI in the last 168 hours. BNP (last 3 results) No results for input(s): PROBNP in the last 8760 hours. HbA1C: No results for input(s): HGBA1C in the last 72 hours. CBG: No results for input(s): GLUCAP in the last 168 hours. Lipid Profile: No results for input(s): CHOL, HDL, LDLCALC, TRIG, CHOLHDL, LDLDIRECT in the last 72 hours. Thyroid Function Tests: No results for input(s): TSH, T4TOTAL, FREET4, T3FREE, THYROIDAB in the last 72 hours. Anemia Panel: No results for input(s): VITAMINB12, FOLATE, FERRITIN, TIBC, IRON, RETICCTPCT in the last 72 hours. Urine analysis:    Component Value Date/Time   COLORURINE YELLOW 06/28/2011 1004   APPEARANCEUR CLEAR 06/28/2011 1004   LABSPEC 1.022 06/28/2011 1004   PHURINE 6.0 06/28/2011 1004   GLUCOSEU NEGATIVE 06/28/2011 1004   HGBUR NEGATIVE 06/28/2011 1004   BILIRUBINUR NEGATIVE 06/28/2011 1004   KETONESUR NEGATIVE 06/28/2011 1004   PROTEINUR NEGATIVE 06/28/2011 1004   UROBILINOGEN 0.2 06/28/2011 1004   NITRITE NEGATIVE 06/28/2011 1004   LEUKOCYTESUR NEGATIVE 06/28/2011 1004   Sepsis  Labs: @LABRCNTIP (procalcitonin:4,lacticidven:4) )No results found for this or any previous visit (from the past 240  hour(s)).   Radiological Exams on Admission: Dg Chest 2 View  Result Date: 09/25/2016 CLINICAL DATA:  Initial evaluation for acute wheezing, COPD. EXAM: CHEST  2 VIEW COMPARISON:  Prior radiograph from 07/27/2012. FINDINGS: Cardiac and mediastinal silhouettes are stable in size and contour, and remain within normal limits. Tortuosity the intrathoracic aorta noted. Lungs are hypoinflated with secondary bibasilar bronchovascular crowding. No other focal infiltrates identified. Scattered peribronchial thickening like related COPD. No pulmonary edema or pleural effusion. No pneumothorax. No acute osseous abnormality. IMPRESSION: 1. Shallow lung inflation with secondary mild bibasilar bronchovascular crowding. No other active cardiopulmonary disease identified. 2. COPD. Electronically Signed   By: Rise Mu M.D.   On: 09/25/2016 23:53   Dg Knee Complete 4 Views Left  Result Date: 09/26/2016 CLINICAL DATA:  Left knee pain since total knee arthroplasty 2.5 weeks ago. EXAM: LEFT KNEE - COMPLETE 4+ VIEW COMPARISON:  09/06/2016 radiographs FINDINGS: New moderate suprapatellar joint effusion with anterior soft tissue swelling overlying a total knee arthroplasty. No evidence of loosening nor hardware malfunction. No postoperative fracture. IMPRESSION: Soft tissue swelling anterior to the left knee arthroplasty moderate suprapatellar joint effusion. No fracture nor bone destruction. No hardware failure. Electronically Signed   By: Tollie Eth M.D.   On: 09/26/2016 02:36    EKG: Independently reviewed. Sinus tachycardia  Assessment/Plan Principal Problem:   COPD exacerbation (HCC) - will continue IV Steroids, Nebs Q 6 hrs and Zithromax - CXR negative for pneumonia - Tessalon and Tussionex for cough - O2 as needed  Active Problems:     Benign essential HTN - cont home meds and  follow BP  Recent left Knee surgery - cont PT - has been on Demerol per ortho as he state Dilaudid did not help hime    GERD (gastroesophageal reflux disease) - cont PPI   Migraines - cont Triptan as needed   DVT prophylaxis: Heparin Code Status: DNR  Family Communication: daughter  Disposition Plan: admit to med/surg  Consults called:   Admission status: inpatient    [facility] MD [facility] Pager: [email or web address] Password [email or web address] 7PM-7AM, please contact night-coverage   09/26/2016, 2:55 PM

## 2016-09-26 NOTE — ED Notes (Signed)
5E states that they will call me back to get report.

## 2016-09-26 NOTE — Progress Notes (Signed)
This is a no charge note   Pending admission per Dr. Nicanor AlconPalumbo  66 year old male with past medical history for COPD, hypertension, hyperlipidemia, anxiety, who presents with cough, shortness of breath, wheezing. Patient is taking Tamiflu empirically due to sick contact. Patient had left knee replacement last month, but no chest pain or tenderness over calf areas. Chest x-ray has no infiltration. Clinically consistent with COPD exacerbation per EDP. Patient is accepted to telemetry bed as inpatient.  Lorretta HarpXilin Caylyn Tedeschi, MD  [facility] Pager [phone]  If 7PM-7AM, please contact night-coverage [email or web address] Password [email or web address] 09/26/2016, 2:55 AM

## 2016-09-26 NOTE — Progress Notes (Signed)
RN received report from ED. Pt arrived unit, alert and oriented, able to communicate needs. Will continue with current plan of care. 

## 2016-09-26 NOTE — ED Notes (Addendum)
Pt requested that his IV be moved. [facility] This RN dc'd that IV. When I was pulling off the tape, pt began screaming and made fist and held it up at me, but did not swing. When this RN was replacing the IV, following skin puncture, pt stated "You're going to leave here with 2 black eyes", and once again raised his fist, but this time threatened to swing. Pt insisted that I remove the angiocath that had just punctured the skin, but was not yet placed. After stressing to the patient the importance of IV access, I was able to place an IV in pt L forearm. No aggressive behavior noted with this IV.

## 2016-09-27 DIAGNOSIS — J09X2 Influenza due to identified novel influenza A virus with other respiratory manifestations: Secondary | ICD-10-CM

## 2016-09-27 DIAGNOSIS — I482 Chronic atrial fibrillation: Secondary | ICD-10-CM | POA: Diagnosis not present

## 2016-09-27 DIAGNOSIS — I10 Essential (primary) hypertension: Secondary | ICD-10-CM | POA: Diagnosis not present

## 2016-09-27 DIAGNOSIS — R0602 Shortness of breath: Secondary | ICD-10-CM | POA: Diagnosis not present

## 2016-09-27 DIAGNOSIS — J441 Chronic obstructive pulmonary disease with (acute) exacerbation: Secondary | ICD-10-CM | POA: Diagnosis not present

## 2016-09-27 DIAGNOSIS — K219 Gastro-esophageal reflux disease without esophagitis: Secondary | ICD-10-CM | POA: Diagnosis not present

## 2016-09-27 LAB — BASIC METABOLIC PANEL
Anion gap: 10 (ref 5–15)
BUN: 18 mg/dL (ref 6–20)
CHLORIDE: 106 mmol/L (ref 101–111)
CO2: 24 mmol/L (ref 22–32)
CREATININE: 0.99 mg/dL (ref 0.61–1.24)
Calcium: 9.5 mg/dL (ref 8.9–10.3)
GFR calc Af Amer: >60 mL/min (ref >60)
GFR calc non Af Amer: >60 mL/min (ref >60)
GLUCOSE: 175 mg/dL — AB (ref 65–99)
Potassium: 4.4 mmol/L (ref 3.5–5.1)
Sodium: 140 mmol/L (ref 135–145)

## 2016-09-27 MED ORDER — PREDNISONE 10 MG PO TABS: 60.0000 mg | ORAL_TABLET | Freq: Every day | ORAL | 0 refills | Status: AC

## 2016-09-27 MED ORDER — BENZONATATE 100 MG PO CAPS
100.0000 mg | ORAL_CAPSULE | Freq: Three times a day (TID) | ORAL | 0 refills | Status: DC
Start: 2016-09-27 — End: 2021-02-21

## 2016-09-27 MED ORDER — AZITHROMYCIN 250 MG PO TABS: 250.0000 mg | ORAL_TABLET | Freq: Every day | ORAL | 0 refills | Status: AC

## 2016-09-27 MED ORDER — HYDROCOD POLST-CPM POLST ER 10-8 MG/5ML PO SUER: 5.0000 mL | Freq: Two times a day (BID) | ORAL | 0 refills | Status: DC

## 2016-09-27 NOTE — Discharge Summary (Signed)
Physician Discharge Summary  [place] WUJ:[id number] DOB: 1951/08/08 DOA: 09/25/2016  PCP: Feliciana Rossetti, MD  Admit date: 09/25/2016 Discharge date: 09/27/2016  Admitted From: home Disposition:  home   Recommendations for Outpatient Follow-up:  1. none  Home Health:  none  Equipment/Devices:  none    Discharge Condition:  stable   CODE STATUS:  Full code   Diet recommendation:   Heart healthy Consultations:  none    Discharge Diagnoses:  Principal Problem:   COPD exacerbation (HCC) Active Problems:   Influenza due to identified novel influenza A virus with other respiratory manifestations   Chronic a-fib (HCC)   Benign essential HTN   GERD (gastroesophageal reflux disease)    Subjective: Cough is mild. No dyspnea or wheeze.   Brief Summary: Ethan Harper is a 66 y.o. 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 He has developed severe dyspnea as well. No fevers or chills. No sputum production. Found to have respiratory distress in ER ad required 4 neb treatment. By the time of my eval, he is comfortable and not dyspneic. He has been exposed to family members with influenza and has been taking prophylactic Tamiflu.   Hospital Course:  Principal Problem:   COPD exacerbation due to Influenza A - improved significantly with IV Steroids, Nebs Q 6 hrs and Zithromax - Flu swab + yesterday- out of window for treatment as symptoms started about 3 wk ago but were exacerabaed about 1 wk ago - CXR negative for pneumonia - Tessalon and Tussionex for cough - cont Prednisone taper and Zithromax at home- he has a nebulizer machine if needed but lungs have been quite clear since yesterday. - ambulated > 500 feet without drop in pulse ox    Active Problems:     Benign essential HTN - cont home meds and follow BP  Recent left Knee surgery - cont PT - has been on  Demerol per ortho as he state Dilaudid did not help hime    GERD (gastroesophageal reflux disease) - cont PPI   Migraines - cont Triptan as needed   Discharge Instructions   Allergies as of 09/27/2016   No Known Allergies     Medication List    TAKE these medications   albuterol (2.5 MG/3ML) 0.083% nebulizer solution Commonly known as:  PROVENTIL Take 3 mLs (2.5 mg total) by nebulization every 4 (four) hours as needed for wheezing.   amitriptyline 50 MG tablet Commonly known as:  ELAVIL Take 50 mg by mouth at bedtime.   amLODipine 10 MG tablet Commonly known as:  NORVASC Take 10 mg by mouth daily.   aspirin EC 81 MG tablet Take 81 mg by mouth 2 (two) times daily.   atorvastatin 10 MG tablet Commonly known as:  LIPITOR Take 10 mg by mouth at bedtime.   azelastine 0.1 % nasal spray Commonly known as:  ASTELIN Place 2 sprays into the nose daily as needed for allergies. Use in each nostril as directed   azithromycin 250 MG tablet Commonly known as:  ZITHROMAX Take 1 tablet (250 mg total) by mouth daily.   benzonatate 100 MG capsule Commonly known as:  TESSALON Take 1 capsule (100 mg total) by mouth 3 (three) times daily.   budesonide-formoterol 160-4.5 MCG/ACT inhaler Commonly known as:  SYMBICORT Take 2 puffs first thing in am and then another 2 puffs about 12 hours later.  What changed:  how much to take  how to take this  when to take this  reasons to take this  additional instructions   chlorpheniramine-HYDROcodone 10-8 MG/5ML Suer Commonly known as:  TUSSIONEX Take 5 mLs by mouth every 12 (twelve) hours.   esomeprazole 40 MG capsule Commonly known as:  NEXIUM Take 40 mg by mouth daily before breakfast.   furosemide 20 MG tablet Commonly known as:  LASIX Take 20 mg by mouth daily as needed for fluid or edema.   losartan-hydrochlorothiazide 100-12.5 MG tablet Commonly known as:  HYZAAR Take 1 tablet by mouth daily.   lubiprostone 8 MCG  capsule Commonly known as:  AMITIZA Take 8 mcg by mouth daily with breakfast.   meperidine 50 MG tablet Commonly known as:  DEMEROL Take 50-100 mg by mouth every 4 (four) hours as needed for moderate pain.   montelukast 10 MG tablet Commonly known as:  SINGULAIR Take 10 mg by mouth at bedtime.   MOVANTIK 25 MG Tabs tablet Generic drug:  naloxegol oxalate Take 25 mg by mouth daily.   predniSONE 10 MG tablet Commonly known as:  DELTASONE Take 6 tablets (60 mg total) by mouth daily with breakfast. Take 6 tabs tomorrow and then decrease by 1 tab daily until finished   triamcinolone 55 MCG/ACT nasal inhaler Commonly known as:  NASACORT Place 2 sprays into the nose daily as needed (allergies).   ZOLMitriptan 2.5 MG tablet Commonly known as:  ZOMIG Take 5 mg by mouth as needed. For migraine.  May repeat once after 2 hours.  Max 10mg /day   zolpidem 10 MG tablet Commonly known as:  AMBIEN Take 10 mg by mouth at bedtime.       No Known Allergies   Procedures/Studies:   Dg Chest 2 View  Result Date: 09/25/2016 CLINICAL DATA:  Initial evaluation for acute wheezing, COPD. EXAM: CHEST  2 VIEW COMPARISON:  Prior radiograph from 07/27/2012. FINDINGS: Cardiac and mediastinal silhouettes are stable in size and contour, and remain within normal limits. Tortuosity the intrathoracic aorta noted. Lungs are hypoinflated with secondary bibasilar bronchovascular crowding. No other focal infiltrates identified. Scattered peribronchial thickening like related COPD. No pulmonary edema or pleural effusion. No pneumothorax. No acute osseous abnormality. IMPRESSION: 1. Shallow lung inflation with secondary mild bibasilar bronchovascular crowding. No other active cardiopulmonary disease identified. 2. COPD. Electronically Signed   By: Rise Mu M.D.   On: 09/25/2016 23:53   Dg Knee Complete 4 Views Left  Result Date: 09/26/2016 CLINICAL DATA:  Left knee pain since total knee arthroplasty  2.5 weeks ago. EXAM: LEFT KNEE - COMPLETE 4+ VIEW COMPARISON:  09/06/2016 radiographs FINDINGS: New moderate suprapatellar joint effusion with anterior soft tissue swelling overlying a total knee arthroplasty. No evidence of loosening nor hardware malfunction. No postoperative fracture. IMPRESSION: Soft tissue swelling anterior to the left knee arthroplasty moderate suprapatellar joint effusion. No fracture nor bone destruction. No hardware failure. Electronically Signed   By: Tollie Eth M.D.   On: 09/26/2016 02:36   Dg Knee Left Port  Result Date: 09/06/2016 CLINICAL DATA:  66 y/o  M; left knee replacement. EXAM: PORTABLE LEFT KNEE - 1-2 VIEW COMPARISON:  09/06/2016 left knee radiographs. FINDINGS: Postsurgical changes related to total knee replacement with patellar resurfacing and prosthesis. There is air within the joint space and soft tissues as well as edema. There is no hardware related complication or periprosthetic lucency identified. Vascular calcifications. No acute fracture. IMPRESSION: Expected postsurgical changes related to total knee replacement.  No acute fracture identified. Electronically Signed   By: Mitzi Hansen M.D.   On: 09/06/2016 22:30   Dg Knee Left Port  Result Date: 09/06/2016 CLINICAL DATA:  Preoperative evaluation for knee replacement. Arthropathy. EXAM: PORTABLE LEFT KNEE - 1-2 VIEW COMPARISON:  None. FINDINGS: Frontal and lateral views were obtained. There is no fracture or dislocation. There is a moderate knee joint effusion. There is marked narrowing medially and in the patellofemoral joint region. There is spurring in all compartments. 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 There is atherosclerotic calcification in the distal superficial femoral artery and popliteal artery. IMPRESSION: Extensive arthropathy, most marked medially and in the patellofemoral joint. Moderate joint effusion. 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 No fracture or dislocation. There is arterial vascular calcifications/atherosclerosis. Electronically Signed   By: Bretta Bang III M.D.   On: 09/06/2016 14:30       Discharge Exam: Vitals:   09/26/16 2011 09/27/16 0653  BP: 135/73 (!) 135/91  Pulse: (!) 114 100  Resp: 20 20  Temp: 98.1 F (36.7 C) 97.8 F (36.6 C)   Vitals:   09/26/16 2025 09/27/16 0653 09/27/16 0744 09/27/16 1027  BP:  (!) 135/91    Pulse:  100    Resp:  20    Temp:  97.8 F (36.6 C)    TempSrc:  Oral    SpO2: 97% 93% 96% 97%  Weight:      Height:        General: Pt is alert, awake, not in acute distress Cardiovascular: RRR, S1/S2 +, no rubs, no gallops Respiratory: CTA bilaterally, no wheezing, no rhonchi Abdominal: Soft, NT, ND, bowel sounds + Extremities: no edema, no cyanosis    The results of significant diagnostics from this hospitalization (including imaging, microbiology, ancillary and laboratory) are listed below for reference.     Microbiology: No results found for this or any previous visit (from the past 240 hour(s)).   Labs: BNP (last 3 results) No results for input(s): BNP in the last 8760 hours. Basic Metabolic Panel:  Recent Labs Lab 09/26/16 0001 09/26/16 0833 09/27/16 0617  NA 139  --  140  K 3.1*  --  4.4  CL 100*  --  106  CO2  --   --  24  GLUCOSE 100*  --  175*  BUN 7  --  18  CREATININE 1.10 1.15 0.99  CALCIUM  --   --  9.5   Liver Function Tests: No results for input(s): AST, ALT, ALKPHOS, BILITOT, PROT, ALBUMIN in the last 168 hours. No results for input(s): LIPASE, AMYLASE in the last 168 hours. No results for input(s): AMMONIA in the last 168 hours. CBC:  Recent Labs Lab 09/25/16 2343 09/26/16 0001 09/26/16 0833  WBC 9.1  --  4.2  NEUTROABS 5.9  --   --   HGB 14.0 14.6 13.9  HCT 41.5 43.0 41.4  MCV 85.4  --  86.3  PLT 512*  --  532*   Cardiac Enzymes: No results for input(s): CKTOTAL, CKMB, CKMBINDEX,  TROPONINI in the last 168 hours. BNP: Invalid input(s): POCBNP CBG: No results for input(s): GLUCAP in the last 168 hours. D-Dimer No results for input(s): DDIMER in the last 72 hours. Hgb A1c No results for input(s): HGBA1C in the last 72 hours. Lipid Profile No results for input(s): CHOL, HDL, LDLCALC, TRIG, CHOLHDL, LDLDIRECT in the last 72 hours. Thyroid function studies No results for input(s): TSH,  T4TOTAL, T3FREE, THYROIDAB in the last 72 hours.  Invalid input(s): FREET3 Anemia work up No results for input(s): VITAMINB12, FOLATE, FERRITIN, TIBC, IRON, RETICCTPCT in the last 72 hours. Urinalysis    Component Value Date/Time   COLORURINE YELLOW 06/28/2011 1004   APPEARANCEUR CLEAR 06/28/2011 1004   LABSPEC 1.022 06/28/2011 1004   PHURINE 6.0 06/28/2011 1004   GLUCOSEU NEGATIVE 06/28/2011 1004   HGBUR NEGATIVE 06/28/2011 1004   BILIRUBINUR NEGATIVE 06/28/2011 1004   KETONESUR NEGATIVE 06/28/2011 1004   PROTEINUR NEGATIVE 06/28/2011 1004   UROBILINOGEN 0.2 06/28/2011 1004   NITRITE NEGATIVE 06/28/2011 1004   LEUKOCYTESUR NEGATIVE 06/28/2011 1004   Sepsis Labs Invalid input(s): PROCALCITONIN,  WBC,  LACTICIDVEN Microbiology No results found for this or any previous visit (from the past 240 hour(s)).   Time coordinating discharge: Over 30 minutes  SIGNED:   Calvert CantorIZWAN,Kariya Lavergne, MD  [facility] 09/27/2016, 10:29 AM Pager   If 7PM-7AM, please contact night-coverage [email or web address] Password [email or web address]

## 2016-09-27 NOTE — Progress Notes (Signed)
DC instructions reviewed with patient, IV removed, RN escorted pt to vehicle to go home.

## 2017-01-23 ENCOUNTER — Other Ambulatory Visit: Payer: Self-pay | Admitting: *Deleted

## 2017-01-23 DIAGNOSIS — I70219 Atherosclerosis of native arteries of extremities with intermittent claudication, unspecified extremity: Secondary | ICD-10-CM

## 2017-03-03 ENCOUNTER — Encounter: Payer: Self-pay | Admitting: Vascular Surgery

## 2017-03-12 ENCOUNTER — Encounter: Payer: Self-pay | Admitting: Vascular Surgery

## 2017-03-12 ENCOUNTER — Ambulatory Visit (HOSPITAL_COMMUNITY)
Admission: RE | Admit: 2017-03-12 | Discharge: 2017-03-12 | Disposition: A | Discharge status: Home / Self Care | Payer: Medicare HMO | Source: Ambulatory Visit | Attending: Vascular Surgery | Admitting: Vascular Surgery

## 2017-03-12 ENCOUNTER — Ambulatory Visit (INDEPENDENT_AMBULATORY_CARE_PROVIDER_SITE_OTHER): Payer: Medicare HMO | Admitting: Vascular Surgery

## 2017-03-12 VITALS — BP 123/83 | HR 75 | Temp 97.3°F | Resp 20 | Ht 71.0 in | Wt 218.0 lb

## 2017-03-12 DIAGNOSIS — I739 Peripheral vascular disease, unspecified: Secondary | ICD-10-CM

## 2017-03-12 DIAGNOSIS — I70219 Atherosclerosis of native arteries of extremities with intermittent claudication, unspecified extremity: Secondary | ICD-10-CM | POA: Insufficient documentation

## 2017-03-12 NOTE — Progress Notes (Signed)
Patient name: Ethan ByarsColumbus Heese MRN: [id number] DOB: 1951/07/03 Sex: male   REASON FOR CONSULT:    Claudication. The consult is requested by Dr. Ranell PatrickNorris.  HPI:   Mills Excell SeltzerBaker is a pleasant 66 y.o. male, who is referred for evaluation of PVD. He had a knee replacement on the left on 09/06/2016. On one of his postop x-ray she was noted to have some calcium in the arteries and for this reason was sent for vascular consultation.  On my history he denies any history of claudication, rest pain, or nonhealing ulcers.  His risk factors for peripheral vascular disease include diabetes, hypertension, and hypercholesterolemia. He denies any family history of premature cardiovascular disease. He denies any tobacco use.  I have reviewed the records that were sent from the referring office. The patient has had a total knee arthroplasty. Recent x-ray showed the left total knee was in good position and placement.  Past Medical History:  Diagnosis Date  . Anxiety   . Atrial fibrillation (HCC)   . Bursitis   . Chronic sinusitis   . COPD (chronic obstructive pulmonary disease) (HCC)   . Dysrhythmia   . Early cataracts, bilateral   . Full dentures   . GERD (gastroesophageal reflux disease)   . History of bronchitis   . History of hiatal hernia   . History of pneumonia   . Hyperlipidemia   . Hypertension   . Migraine   . Osteoarthritis   . Plantar fasciitis   . Shortness of breath dyspnea   . Sleep apnea    pt. refuses CPAP  . Urinary frequency   . Vitamin D deficiency   . Wears glasses     Family History  Problem Relation Age of Onset  . Breast cancer Mother        with mets to bone and lungs  . Emphysema Father        was a smoker  . Heart disease Father   . AAA (abdominal aortic aneurysm) Father     SOCIAL HISTORY: Social History   Social History  . Marital status: Married    Spouse name: N/A  . Number of children: 2  . Years of education: N/A   Occupational History  .  Retired Forensic scientistcoal miner Disabled    Social History Main Topics  . Smoking status: Never Smoker  . Smokeless tobacco: Never Used  . Alcohol use No  . Drug use: No  . Sexual activity: Not on file   Other Topics Concern  . Not on file   Social History Narrative  . No narrative on file    No Known Allergies  Current Outpatient Prescriptions  Medication Sig Dispense Refill  . albuterol (PROVENTIL) (2.5 MG/3ML) 0.083% nebulizer solution Take 3 mLs (2.5 mg total) by nebulization every 4 (four) hours as needed for wheezing. 75 mL   . amitriptyline (ELAVIL) 50 MG tablet Take 50 mg by mouth at bedtime.      Marland Kitchen. amLODipine (NORVASC) 10 MG tablet Take 10 mg by mouth daily.      Marland Kitchen. atorvastatin (LIPITOR) 10 MG tablet Take 10 mg by mouth at bedtime.  5  . azelastine (ASTELIN) 137 MCG/SPRAY nasal spray Place 2 sprays into the nose daily as needed for allergies. Use in each nostril as directed    . budesonide-formoterol (SYMBICORT) 160-4.5 MCG/ACT inhaler Take 2 puffs first thing in am and then another 2 puffs about 12 hours later. (Patient taking differently: Inhale 2 puffs into the lungs  2 (two) times daily as needed (shortness of breath). ) 1 Inhaler 12  . chlorpheniramine-HYDROcodone (TUSSIONEX) 10-8 MG/5ML SUER Take 5 mLs by mouth every 12 (twelve) hours. 140 mL 0  . esomeprazole (NEXIUM) 40 MG capsule Take 40 mg by mouth daily before breakfast.      . furosemide (LASIX) 20 MG tablet Take 20 mg by mouth daily as needed for fluid or edema.     Marland Kitchen losartan-hydrochlorothiazide (HYZAAR) 100-12.5 MG tablet Take 1 tablet by mouth daily.  3  . lubiprostone (AMITIZA) 8 MCG capsule Take 8 mcg by mouth daily with breakfast.    . meperidine (DEMEROL) 50 MG tablet Take 50-100 mg by mouth every 4 (four) hours as needed for moderate pain.   0  . montelukast (SINGULAIR) 10 MG tablet Take 10 mg by mouth at bedtime.    . naloxegol oxalate (MOVANTIK) 25 MG TABS tablet Take 25 mg by mouth daily.    Marland Kitchen triamcinolone  (NASACORT) 55 MCG/ACT nasal inhaler Place 2 sprays into the nose daily as needed (allergies).     Marland Kitchen ZOLMitriptan (ZOMIG) 2.5 MG tablet Take 5 mg by mouth as needed. For migraine.  May repeat once after 2 hours.  Max 10mg /day     . zolpidem (AMBIEN) 10 MG tablet Take 10 mg by mouth at bedtime.      Marland Kitchen aspirin EC 81 MG tablet Take 81 mg by mouth 2 (two) times daily.    . benzonatate (TESSALON) 100 MG capsule Take 1 capsule (100 mg total) by mouth 3 (three) times daily. (Patient not taking: Reported on 03/12/2017) 20 capsule 0  . predniSONE (DELTASONE) 10 MG tablet Take 6 tablets (60 mg total) by mouth daily with breakfast. Take 6 tabs tomorrow and then decrease by 1 tab daily until finished (Patient not taking: Reported on 03/12/2017) 21 tablet 0   No current facility-administered medications for this visit.     REVIEW OF SYSTEMS:  [X]  denotes positive finding, [ ]  denotes negative finding Cardiac  Comments:  Chest pain or chest pressure:    Shortness of breath upon exertion: X   Short of breath when lying flat:    Irregular heart rhythm:        Vascular    Pain in calf, thigh, or hip brought on by ambulation: X   Pain in feet at night that wakes you up from your sleep:     Blood clot in your veins:    Leg swelling:  X       Pulmonary    Oxygen at home:    Productive cough:     Wheezing:         Neurologic    Sudden weakness in arms or legs:     Sudden numbness in arms or legs:     Sudden onset of difficulty speaking or slurred speech:    Temporary loss of vision in one eye:     Problems with dizziness:         Gastrointestinal    Blood in stool:     Vomited blood:         Genitourinary    Burning when urinating:     Blood in urine:        Psychiatric    Major depression:         Hematologic    Bleeding problems:    Problems with blood clotting too easily:        Skin    Rashes or ulcers:  Constitutional    Fever or chills:     PHYSICAL EXAM:   Vitals:    03/12/17 0836  BP: 123/83  Pulse: 75  Resp: 20  Temp: (!) 97.3 F (36.3 C)  TempSrc: Oral  SpO2: 95%  Weight: 218 lb (98.9 kg)  Height: 5\' 11"  (1.803 m)    GENERAL: The patient is a well-nourished male, in no acute distress. The vital signs are documented above. CARDIAC: There is a regular rate and rhythm.  VASCULAR: I do not detect carotid bruits. He has palpable femoral, popliteal, dorsalis pedis, and posterior tibial pulses bilaterally. PULMONARY: There is good air exchange bilaterally without wheezing or rales. ABDOMEN: Soft and non-tender with normal pitched bowel sounds. I do not appreciate an abdominal aortic aneurysm. MUSCULOSKELETAL: There are no major deformities or cyanosis. NEUROLOGIC: No focal weakness or paresthesias are detected. SKIN: There are no ulcers or rashes noted. PSYCHIATRIC: The patient has a normal affect.  DATA:    LOWER EXTREMITY ARTERIAL DOPPLER: I have independently interpreted his lower extremity arterial Doppler study.  On the right side there is a triphasic dorsalis pedis and posterior tibial signal. ABI is 100%. Toe pressure on the right is 146 mmHg.  On the left side there is a triphasic dorsalis pedis and posterior tibial signal. ABI on the left is 100%. Toe pressure on the left is 139 mmHg.  MEDICAL ISSUES:   PERIPHERAL VASCULAR DISEASE: Although the patient does have some calcium in his arteries, he has no evidence of significant occlusive disease. He has palpable pedal pulses, normal Doppler signals, normal ABIs, and normal toe pressures. I reassured him that this is oftentimes seen in patients with diabetes but this is not anything to be concerned about. His arteries are wide open. Fortunately, he is not a smoker. I've encouraged him to stay as active as possible. I'll be happy to see him back at any time if new issues arise.  Waverly Ferrari [facility] [phone]

## 2017-04-19 IMAGING — CR DG KNEE COMPLETE 4+V*L*
4 series · 4 of 4 positions shown · non-contrast
Comparison: 09/06/2016 radiographs

CLINICAL DATA: Left knee pain since total knee arthroplasty
weeks ago.

EXAM:
LEFT KNEE - COMPLETE 4+ VIEW

[x knee lat left (1 of 4)]
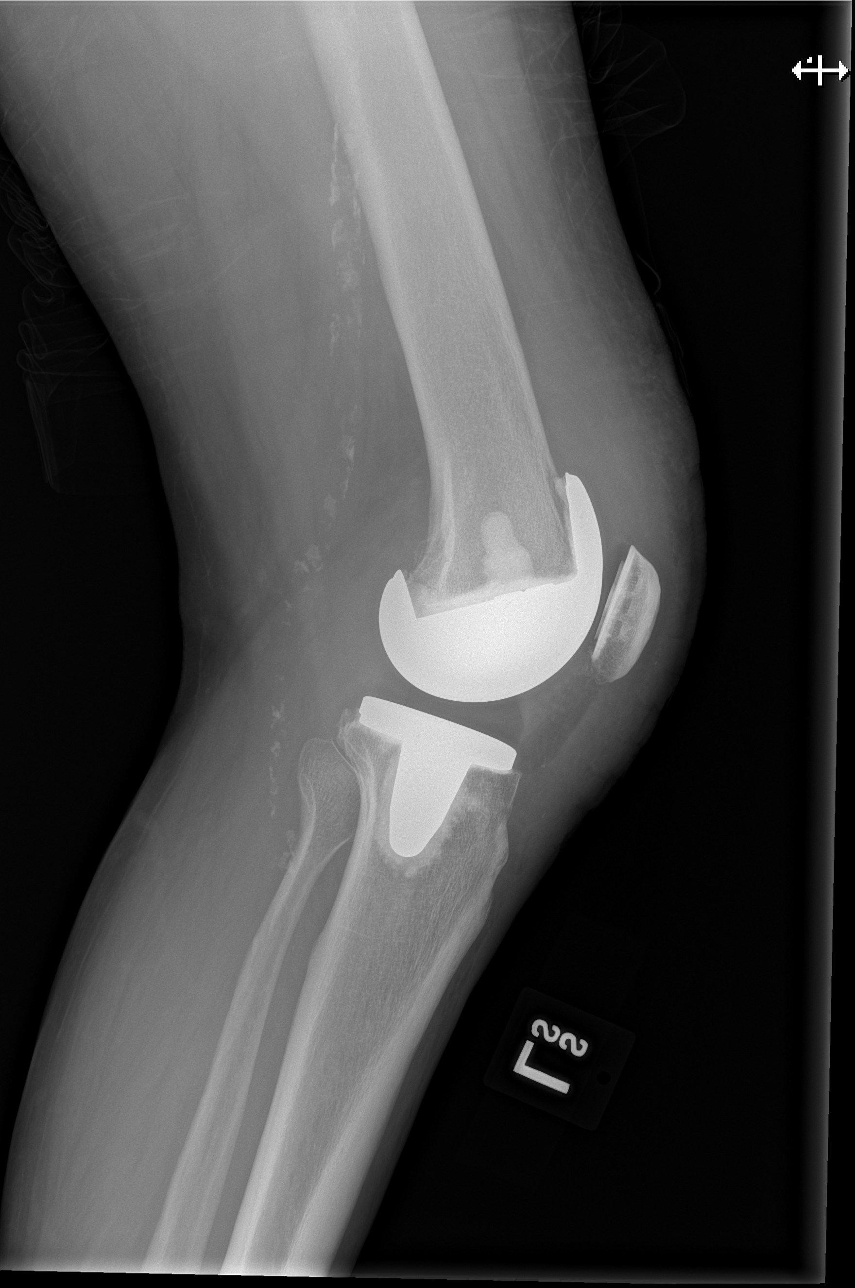

[x knee lat left (2 of 4)]
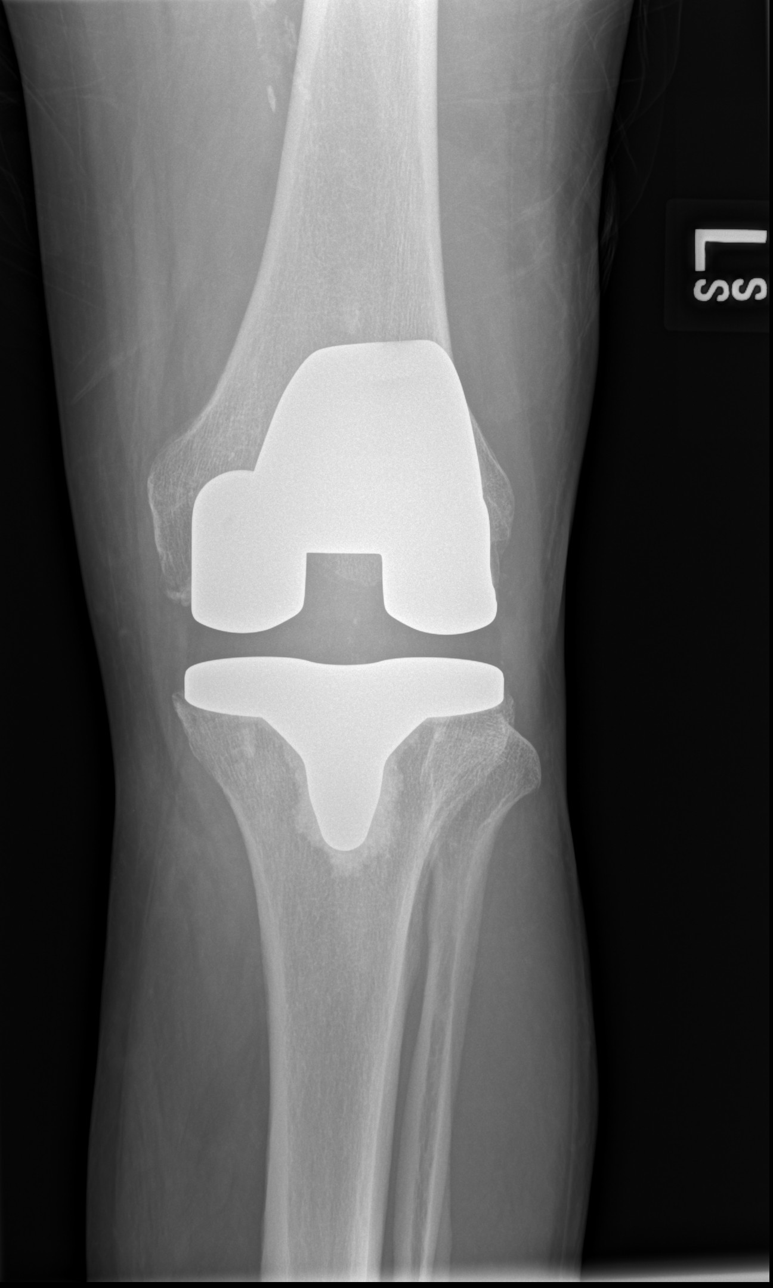

[x knee lat left (3 of 4)]
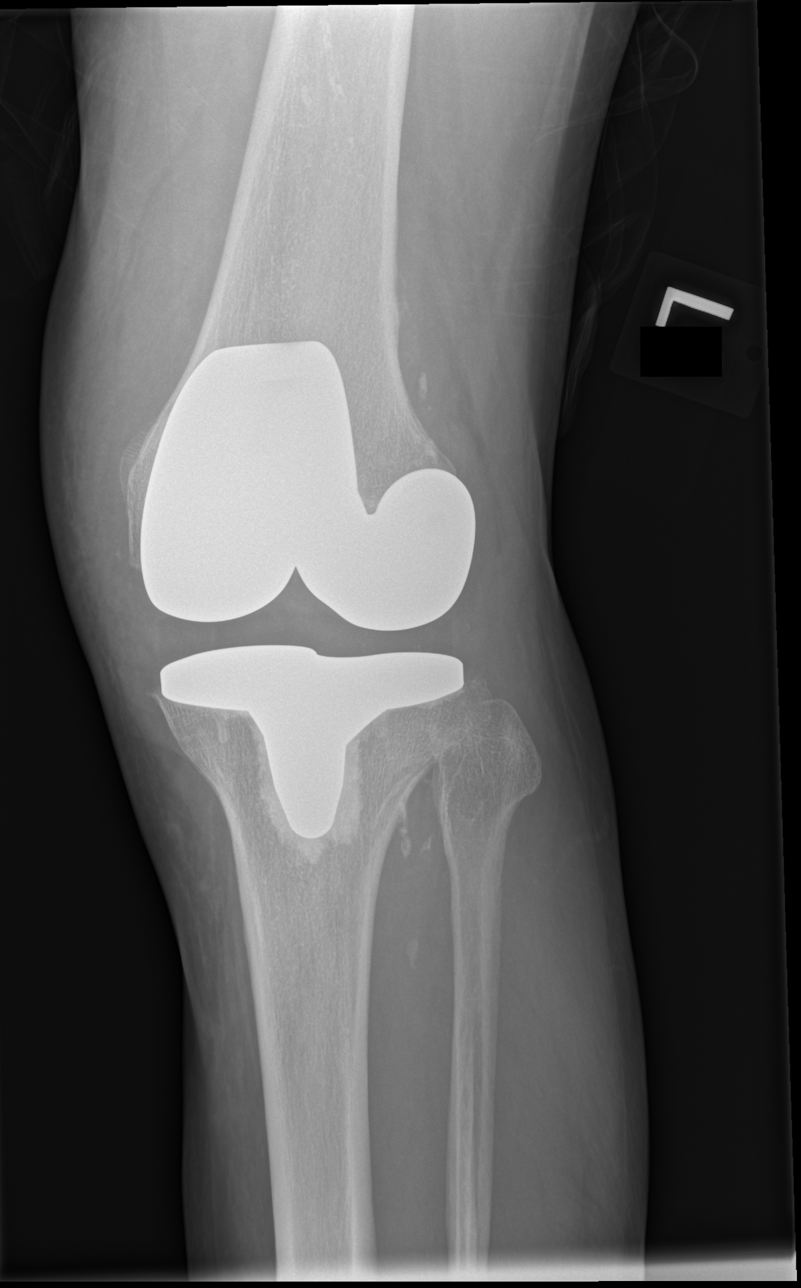

[x knee lat left (4 of 4)]
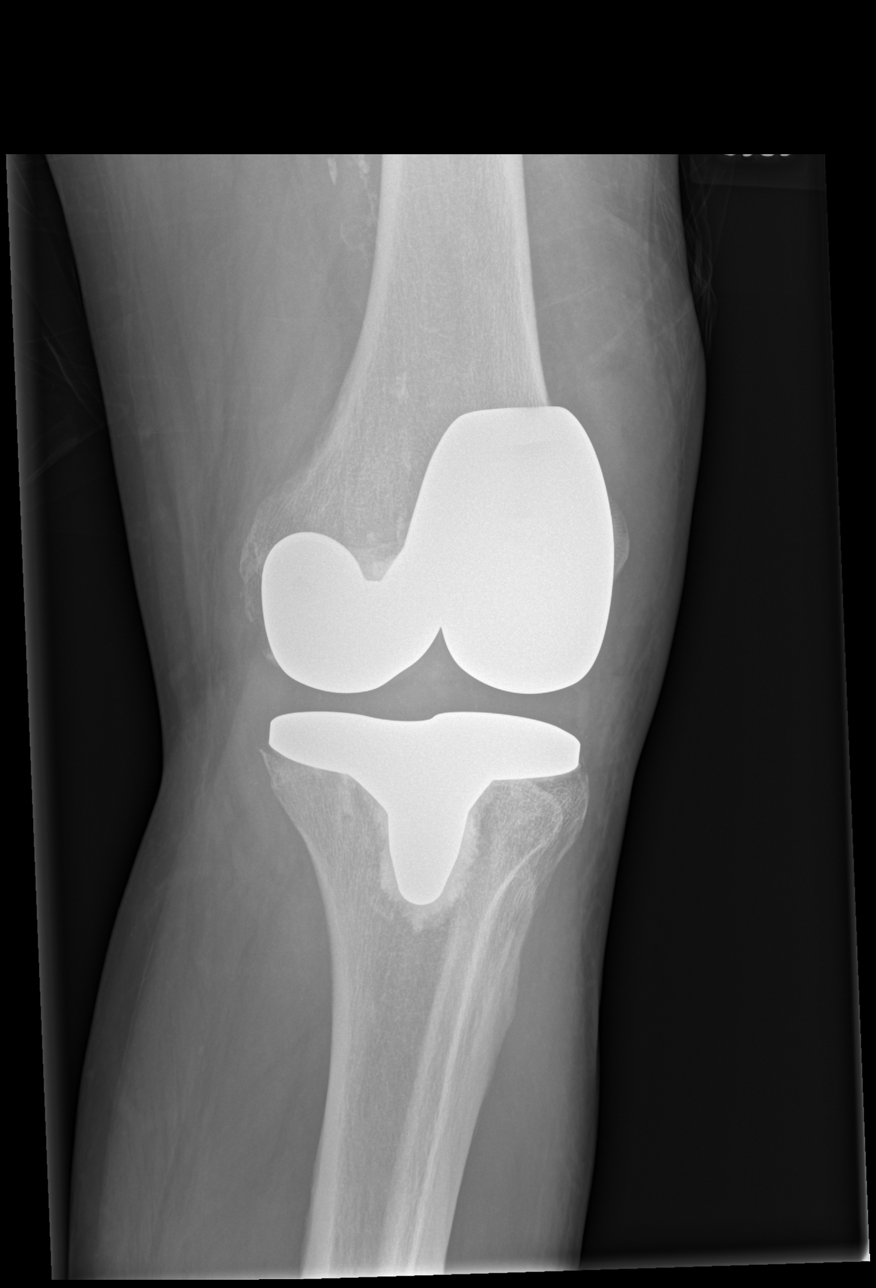

[4 of 4 positions shown; findings below may reference images not displayed]

FINDINGS: New moderate suprapatellar joint effusion with anterior soft tissue
swelling overlying a total knee arthroplasty. No evidence of
loosening nor hardware malfunction. No postoperative fracture.
IMPRESSION: Soft tissue swelling anterior to the left knee arthroplasty moderate
suprapatellar joint effusion. No fracture nor bone destruction. No
hardware failure.

## 2017-04-23 NOTE — Addendum Note (Signed)
Addendum  created 04/23/17 1115 by Tyiesha Brackney, MD   Sign clinical note    

## 2017-11-05 DIAGNOSIS — Z01818 Encounter for other preprocedural examination: Secondary | ICD-10-CM

## 2019-06-23 ENCOUNTER — Encounter: Payer: Self-pay | Admitting: Gastroenterology

## 2021-02-21 ENCOUNTER — Other Ambulatory Visit: Payer: Self-pay

## 2021-02-21 ENCOUNTER — Ambulatory Visit: Payer: Medicare HMO | Admitting: Neurology

## 2021-02-21 ENCOUNTER — Encounter: Payer: Self-pay | Admitting: Neurology

## 2021-02-21 VITALS — BP 131/81 | HR 66 | Ht 71.0 in | Wt 228.3 lb

## 2021-02-21 DIAGNOSIS — H8110 Benign paroxysmal vertigo, unspecified ear: Secondary | ICD-10-CM

## 2021-02-21 NOTE — Progress Notes (Signed)
Subjective:    Patient ID: Ethan Harper is a 70 y.o. male.  HPI    Huston Foley, MD, PhD [facility] [address], [address] [address] [place], [place] [address]  Dear Efraim Kaufmann,   I saw your patient, Ethan Harper, upon your kind request, in my neurologic clinic today for initial consultation of his dizziness.  The patient is unaccompanied by today.  As you know, Ethan Harper is a 70 year old right-handed gentleman with an underlying complex medical history of COPD, paroxysmal atrial fibrillation, reflux disease, hypertension, hyperlipidemia, migraine headaches, arthritis, sleep apnea, vitamin D deficiency, anxiety, Planter fasciitis, and obesity, who reports sudden onset of spinning sensation when he woke up on 02/05/2021.  He fell back into his bed.  He did not notice any other symptoms, felt like he woke up fine, uses his CPAP every night and is followed by Dr. Su Monks for his sleep apnea.  Symptoms were not associated with nausea or vomiting.  He did not have any sudden onset of one-sided weakness or numbness or tingling or droopy face or slurring of speech.  He did not have any severe headache.  He has a longstanding history of migraines and has been on amitriptyline for this.  When he started meclizine recently he was advised to stop taking the Ambien.  Meclizine helps a little bit but does make him drowsy.  Of note, he drove himself to this appointment.  Symptoms have improved.  He still has spinning sensation especially when he tilts his head back.  He denies lightheadedness. I reviewed your office note from 02/05/2021.  He reported new onset dizziness at the time he denied passing out or feeling of lightheadedness but could not keep his balance.  He needed assistance by his wife and daughter. You ordered a brain MRI.  He had a brain MRI with and without contrast through [facility] in [place] on 02/06/2021 and I reviewed the results: Impression: No evidence of acute  abnormality.  No acute infarct.  Moderate chronic microvascular ischemic disease.  Focal thinning of the body of the corpus callosum may represent anatomic variation given the absence of surrounding gliosis.  The sequela of prior insult is a differential consideration.  He was given meclizine for symptomatic treatment for concern for vertigo.  He has not had any physical therapy, he has not seen ENT.  Years ago he was evaluated for his hearing loss and even tried hearing aids in the 56s but did not tolerate them.  He tries to hydrate well with water.  He does not drink any alcohol.  He is a non-smoker, does not drink caffeine daily.  He fell last week and bruised his right rib cage.  He did not hit his head, no loss of consciousness.  His Past Medical History Is Significant For: Past Medical History:  Diagnosis Date   Anxiety    Atrial fibrillation (HCC)    Bursitis    Chronic sinusitis    COPD (chronic obstructive pulmonary disease) (HCC)    Dysrhythmia    Early cataracts, bilateral    Full dentures    GERD (gastroesophageal reflux disease)    History of bronchitis    History of hiatal hernia    History of pneumonia    Hyperlipidemia    Hypertension    Migraine    Osteoarthritis    Plantar fasciitis    Shortness of breath dyspnea    Sleep apnea    pt. refuses CPAP   Urinary frequency  Vitamin D deficiency    Wears glasses     His Past Surgical History Is Significant For: Past Surgical History:  Procedure Laterality Date   CARDIAC CATHETERIZATION     12/15/2008 [facility]: Normal coronaries, NL LVF (Dr. Beverely Pace)   CHOLECYSTECTOMY     COLONOSCOPY     ESOPHAGOGASTRODUODENOSCOPY     EYE SURGERY Right 1987   FINGER SURGERY Right    middle finger   KNEE ARTHROSCOPY Bilateral    right x 4; left x 3   SHOULDER ARTHROSCOPY Right    SHOULDER ARTHROSCOPY WITH SUBACROMIAL DECOMPRESSION, ROTATOR CUFF REPAIR AND BICEP TENDON REPAIR Left 06/08/2015   Procedure: LEFT SHOULDER ARTHROSCOPY WITH  SUBACROMIAL DECOMPRESSION, ROTATOR CUFF REPAIR ;  Surgeon: Francena Hanly, MD;  Location: [facility] OR;  Service: Orthopedics;  Laterality: Left;   TOTAL KNEE ARTHROPLASTY Left 09/06/2016   Procedure: TOTAL KNEE ARTHROPLASTY;  Surgeon: Beverely Low, MD;  Location: [facility] OR;  Service: Orthopedics;  Laterality: Left;    His Family History Is Significant For: Family History  Problem Relation Age of Onset   Breast cancer Mother        with mets to bone and lungs   Emphysema Father        was a smoker   Heart disease Father    AAA (abdominal aortic aneurysm) Father     His Social History Is Significant For: Social History   Socioeconomic History   Marital status: Married    Spouse name: Not on file   Number of children: 2   Years of education: Not on file   Highest education level: Not on file  Occupational History   Occupation: Retired Lobbyist: DISABLED   Tobacco Use   Smoking status: Never   Smokeless tobacco: Never  Vaping Use   Vaping Use: Never used  Substance and Sexual Activity   Alcohol use: No   Drug use: No   Sexual activity: Not on file  Other Topics Concern   Not on file  Social History Narrative   Not on file   Social Determinants of Health   Financial Resource Strain: Not on file  Food Insecurity: Not on file  Transportation Needs: Not on file  Physical Activity: Not on file  Stress: Not on file  Social Connections: Not on file    His Allergies Are:  No Known Allergies:   His Current Medications Are:  Outpatient Encounter Medications as of 02/21/2021  Medication Sig   albuterol (PROVENTIL) (2.5 MG/3ML) 0.083% nebulizer solution Take 3 mLs (2.5 mg total) by nebulization every 4 (four) hours as needed for wheezing.   amitriptyline (ELAVIL) 50 MG tablet Take 50 mg by mouth at bedtime.     amLODipine (NORVASC) 10 MG tablet Take 10 mg by mouth daily.     atorvastatin (LIPITOR) 10 MG tablet Take 10 mg by mouth at bedtime.   azelastine (ASTELIN) 137  MCG/SPRAY nasal spray Place 2 sprays into the nose daily as needed for allergies. Use in each nostril as directed   budesonide-formoterol (SYMBICORT) 160-4.5 MCG/ACT inhaler Take 2 puffs first thing in am and then another 2 puffs about 12 hours later. (Patient taking differently: Inhale 2 puffs into the lungs 2 (two) times daily as needed (shortness of breath).)   esomeprazole (NEXIUM) 40 MG capsule Take 40 mg by mouth daily before breakfast.     furosemide (LASIX) 20 MG tablet Take 20 mg by mouth daily as needed for fluid or edema.  montelukast (SINGULAIR) 10 MG tablet Take 10 mg by mouth at bedtime.   naloxegol oxalate (MOVANTIK) 25 MG TABS tablet Take 25 mg by mouth daily.   predniSONE (DELTASONE) 10 MG tablet Take 6 tablets (60 mg total) by mouth daily with breakfast. Take 6 tabs tomorrow and then decrease by 1 tab daily until finished   triamcinolone (NASACORT) 55 MCG/ACT nasal inhaler Place 2 sprays into the nose daily as needed (allergies).    ZOLMitriptan (ZOMIG) 2.5 MG tablet Take 5 mg by mouth as needed. For migraine.  May repeat once after 2 hours.  Max /day    zolpidem (AMBIEN) 10 MG tablet Take 10 mg by mouth at bedtime.     [DISCONTINUED] aspirin EC 81 MG tablet Take 81 mg by mouth 2 (two) times daily. (Patient not taking: Reported on 02/21/2021)   [DISCONTINUED] benzonatate (TESSALON) 100 MG capsule Take 1 capsule (100 mg total) by mouth 3 (three) times daily. (Patient not taking: Reported on 02/21/2021)   [DISCONTINUED] chlorpheniramine-HYDROcodone (TUSSIONEX) 10-8 MG/5ML SUER Take 5 mLs by mouth every 12 (twelve) hours. (Patient not taking: Reported on 02/21/2021)   [DISCONTINUED] losartan-hydrochlorothiazide (HYZAAR) 100-12.5 MG tablet Take 1 tablet by mouth daily. (Patient not taking: Reported on 02/21/2021)   [DISCONTINUED] lubiprostone (AMITIZA) 8 MCG capsule Take 8 mcg by mouth daily with breakfast. (Patient not taking: Reported on 02/21/2021)   [DISCONTINUED] meperidine  (DEMEROL) 50 MG tablet Take 50-100 mg by mouth every 4 (four) hours as needed for moderate pain.  (Patient not taking: Reported on 02/21/2021)   No facility-administered encounter medications on file as of 02/21/2021.  :   Review of Systems:  Out of a complete 14 point review of systems, all are reviewed and negative with the exception of these symptoms as listed below:   Review of Systems  Neurological:        Here for consult on worsening dizziness. Pt sts sx started 2 weeks ago Monday and are worse when he looks upward. Reports he has stumbled a couple of time due to this issue.    Objective:  Neurological Exam  Physical Exam Physical Examination:   Vitals:   02/21/21 0744  BP: 131/81  Pulse: 66   On orthostatic testing, standing blood pressure is 132/79 with a pulse of 71.  He denies any orthostatic lightheadedness.  General Examination: The patient is a very pleasant 70 y.o. male in no acute distress. He appears well-developed and well-nourished and well groomed.   HEENT: Normocephalic, atraumatic, pupils are equal, round and reactive to light and accommodation. Funduscopic exam is difficult and not completed due to cataracts noted.  Corrective eyeglasses in place.  Tympanic membranes are clear bilaterally, he has a tiny area of superficial injury in the initial part of his right ear canal.  Upon further asking, he does sometimes itch in his ear canals and sometimes uses a nail file to scratch.  He is discouraged from putting any sharp objects in his ear canal.  Extraocular tracking is well-preserved, no nystagmus seen.  Upon certain head position changes he has only minor symptoms, no spinning sensation, no nystagmus.  No nausea.  Face is symmetric with normal facial sensation to light touch, temperature and vibration.  Hearing is grossly intact with tuning fork but left side hearing is perceived better.  Airway examination reveals mild to moderate mouth dryness.  Tongue protrudes  centrally and palate elevates symmetrically.  Moderate airway crowding noted with larger uvula and tonsillar size of about 2+ bilaterally.  No carotid bruits.    Chest: Clear to auscultation without wheezing, rhonchi or crackles noted.  Heart: S1+S2+0, regular and normal without murmurs, rubs or gallops noted.   Abdomen: Soft, non-tender and non-distended with normal bowel sounds appreciated on auscultation.  Extremities: There is 1+ pitting edema in the distal lower extremities bilaterally. Pedal pulses are intact.  Skin: Warm and dry without trophic changes noted. There are no varicose veins.  Musculoskeletal: exam reveals unremarkable bilateral knee replacement scars, mild swelling left knee.  He does have mild left knee tenderness.    Neurologically:  Mental status: The patient is awake, alert and oriented in all 4 spheres. His immediate and remote memory, attention, language skills and fund of knowledge are appropriate. There is no evidence of aphasia, agnosia, apraxia or anomia. Speech is clear with normal prosody and enunciation. Thought process is linear. Mood is normal and affect is normal.  Cranial nerves II - XII are as described above under HEENT exam. In addition: shoulder shrug is normal with equal shoulder height noted. Motor exam: Normal bulk, strength and tone is noted. There is no drift, tremor or rebound. Romberg is negative. Reflexes are 1+ in the upper extremities and absent in the lower extremities. Babinski: Toes are flexor bilaterally. Fine motor skills and coordination: intact with normal finger taps, normal hand movements, normal rapid alternating patting, normal foot taps and normal foot agility.  Cerebellar testing: No dysmetria or intention tremor on finger to nose testing. Heel to shin is unremarkable bilaterally. There is no truncal or gait ataxia.  Sensory exam: intact to light touch, vibration, and temperature sense in the upper and lower extremities.  Gait,  station and balance: He stands easily. No veering to one side is noted. No leaning to one side is noted. Posture is age-appropriate and stance is narrow based. Gait shows normal stride length and normal pace. No problems turning are noted. Tandem walk is challenging in the beginning especially when he looks down but better when he looks straight.                Assessment and Plan:   In summary, Ethan Harper is a very pleasant 70 y.o.-year old male with an underlying complex medical history of COPD, paroxysmal atrial fibrillation, reflux disease, hypertension, hyperlipidemia, migraine headaches, arthritis, sleep apnea, vitamin D deficiency, anxiety, Planter fasciitis, and obesity, who presents for evaluation of his dizziness of approximately 2 weeks duration.  Symptoms started fairly abruptly and are mildly improved.  He has tried meclizine with modest success but it does make him sleepy.  History and examination are supportive of positional vertigo.  Of note, he had a similar episode in 2016.  He feels that this episode is much more dramatic and longer lasting.  He has not been in physical therapy but I do believe he would benefit from vestibular rehab.  He is agreeable and I made a referral to physical therapy in [place].  His recent brain MRI did not show any acute findings, no structural cause for his symptoms, he has moderate white matter changes and does have multiple vascular risk factors.  We talked about chronic changes of the brain and risk factor reduction.  He is on treatment for his chronic medical issues and also compliant with his CPAP.  Since he has noted residual vertiginous symptoms with tilting his head, I would like to proceed with a carotid Doppler ultrasound given his vascular risk factor profile as well.  He is agreeable to pursuing a  carotid ultrasound and we will schedule this in [place] as well.  He is advised that we will call with his results.  Should he have lingering symptoms,  he may benefit from seeing an ENT specialist and he is encouraged to talk to you about a referral if need be.  He is advised to follow-up with you as scheduled, we will call him with his carotid ultrasound results and follow-up in this clinic as needed.  He is advised currently not to drive until he is symptom-free.  He is advised to stay well-hydrated and well rested and change positions slowly. I answered all his questions today and he was in agreement with the plan.  Thank you very much for allowing me to participate in the care of this nice patient. If I can be of any further assistance to you please do not hesitate to call me at [phone].  Sincerely,   Huston FoleySaima Dimas Scheck, MD, PhD

## 2021-02-21 NOTE — Patient Instructions (Signed)
It was nice to meet you today.  I am glad to hear that you feel a little better.  You likely have positional vertigo.  You had similar symptoms in 2016.  If you have lingering symptoms, it may be worthwhile seeing an ENT specialist.  Please talk to your primary care physician or Melissa about a referral to ENT.  For now, I would like to refer you to physical therapy, we can do this at [facility].    For completion, since you do have what we call vascular risk factors, I would like for you to have an ultrasound of your neck arteries.  We can do this at the [facility] location as well.  We will call you with the results and for now we can follow you on an as-needed basis.  Your neurological exam is benign.  Please remember not to put any sharp objects in your ear canals.  I would not recommend that you drive until you are symptom-free.  Please remember, that vertigo can recur without warning, triggers could be stress, sleep deprivation, dehydration and even taking a bumpy car ride, sometimes an acute illness, even a common (viral) cold. It can last hours or days. Please change positions slowly and always stay well-hydrated. Physical therapy with particular attention to vestibular rehabilitation can be very helpful. While there is no specific medication that helps with vertigo, some people get relief with as needed use of meclizine. Certain medications can exacerbate vertigo.

## 2021-02-26 ENCOUNTER — Telehealth: Payer: Self-pay | Admitting: Neurology

## 2021-02-26 NOTE — Telephone Encounter (Signed)
MD placed referral for vestibular rehab to be done at [facility]. I called the hospital @ [phone] and was transferred to rehab where I spoke w/ Maralyn Sago. She advised me to fax the signed order/notes/demographics to her at [phone]. Gave order to MD to sign.

## 2021-02-26 NOTE — Telephone Encounter (Signed)
Faxed signed order with the requested information to [facility].

## 2021-03-21 ENCOUNTER — Ambulatory Visit (INDEPENDENT_AMBULATORY_CARE_PROVIDER_SITE_OTHER): Payer: Medicare HMO

## 2021-03-21 ENCOUNTER — Other Ambulatory Visit: Payer: Self-pay

## 2021-03-21 DIAGNOSIS — R42 Dizziness and giddiness: Secondary | ICD-10-CM | POA: Diagnosis not present

## 2021-03-21 DIAGNOSIS — H8113 Benign paroxysmal vertigo, bilateral: Secondary | ICD-10-CM

## 2021-03-21 DIAGNOSIS — H8110 Benign paroxysmal vertigo, unspecified ear: Secondary | ICD-10-CM

## 2021-03-21 NOTE — Progress Notes (Signed)
Carotid duplex exam has been performed.  Jimmy Javin Nong RDCS, RVT 

## 2021-03-22 ENCOUNTER — Telehealth: Payer: Self-pay | Admitting: *Deleted

## 2021-03-22 NOTE — Telephone Encounter (Signed)
Relayed to pts wife the results of Carotid US (normal flow in vertebral and carotid arteries).  She will let pt know appreciated call back.

## 2021-03-22 NOTE — Progress Notes (Signed)
Absolutely normal flow in both vertebral and carotid arteries-

## 2021-03-22 NOTE — Telephone Encounter (Signed)
-----   Message from Melvyn Novas, MD sent at 03/22/2021 12:49 PM EDT ----- Absolutely normal flow in both vertebral and carotid arteries-

## 2021-05-23 DIAGNOSIS — Z01818 Encounter for other preprocedural examination: Secondary | ICD-10-CM
